# Patient Record
Sex: Male | Born: 1980
Health system: Southern US, Community
[De-identification: ages and names within clinical notes are randomized; demographics above are authoritative.]

## PROBLEM LIST (undated history)

## (undated) DIAGNOSIS — Z87442 Personal history of urinary calculi: Secondary | ICD-10-CM

## (undated) DIAGNOSIS — F411 Generalized anxiety disorder: Secondary | ICD-10-CM

## (undated) DIAGNOSIS — R351 Nocturia: Secondary | ICD-10-CM

## (undated) DIAGNOSIS — E785 Hyperlipidemia, unspecified: Secondary | ICD-10-CM

## (undated) DIAGNOSIS — N200 Calculus of kidney: Secondary | ICD-10-CM

## (undated) DIAGNOSIS — N5089 Other specified disorders of the male genital organs: Secondary | ICD-10-CM

## (undated) DIAGNOSIS — Z8616 Personal history of COVID-19: Secondary | ICD-10-CM

## (undated) DIAGNOSIS — Z973 Presence of spectacles and contact lenses: Secondary | ICD-10-CM

## (undated) HISTORY — DX: Generalized anxiety disorder: F41.1

## (undated) HISTORY — PX: KNEE SURGERY: SHX244

## (undated) HISTORY — DX: Calculus of kidney: N20.0

## (undated) HISTORY — PX: TONSILLECTOMY: SUR1361

## (undated) HISTORY — DX: Hyperlipidemia, unspecified: E78.5

---

## 2003-05-06 HISTORY — PX: MEDIAL PATELLOFEMORAL LIGAMENT REPAIR: SHX2020

## 2010-09-17 ENCOUNTER — Emergency Department (HOSPITAL_COMMUNITY): Payer: 59

## 2010-09-17 ENCOUNTER — Emergency Department (HOSPITAL_COMMUNITY)
Admission: EM | Admit: 2010-09-17 | Discharge: 2010-09-18 | Disposition: A | Discharge status: Home / Self Care | Payer: 59 | Attending: Emergency Medicine | Admitting: Emergency Medicine

## 2010-09-17 DIAGNOSIS — Z79899 Other long term (current) drug therapy: Secondary | ICD-10-CM | POA: Insufficient documentation

## 2010-09-17 DIAGNOSIS — R109 Unspecified abdominal pain: Secondary | ICD-10-CM | POA: Insufficient documentation

## 2010-09-17 DIAGNOSIS — R112 Nausea with vomiting, unspecified: Secondary | ICD-10-CM | POA: Insufficient documentation

## 2010-09-17 DIAGNOSIS — R197 Diarrhea, unspecified: Secondary | ICD-10-CM | POA: Insufficient documentation

## 2010-09-17 LAB — POCT I-STAT, CHEM 8
Calcium, Ion: 1.18 mmol/L (ref 1.12–1.32)
Chloride: 103 mEq/L (ref 96–112)
Creatinine, Ser: 1.2 mg/dL (ref 0.4–1.5)
Glucose, Bld: 111 mg/dL — ABNORMAL HIGH (ref 70–99)
HCT: 45 % (ref 39.0–52.0)

## 2010-09-17 LAB — URINALYSIS, ROUTINE W REFLEX MICROSCOPIC
Bilirubin Urine: NEGATIVE
Nitrite: NEGATIVE
Protein, ur: NEGATIVE mg/dL
Specific Gravity, Urine: 1.025 (ref 1.005–1.030)
Urobilinogen, UA: 0.2 mg/dL (ref 0.0–1.0)

## 2010-09-17 LAB — URINE MICROSCOPIC-ADD ON

## 2012-04-04 ENCOUNTER — Ambulatory Visit (INDEPENDENT_AMBULATORY_CARE_PROVIDER_SITE_OTHER): Payer: BC Managed Care – PPO | Admitting: Family Medicine

## 2012-04-04 VITALS — BP 133/81 | HR 80 | Temp 97.8°F | Resp 16 | Ht 71.5 in | Wt 189.0 lb

## 2012-04-04 DIAGNOSIS — R05 Cough: Secondary | ICD-10-CM

## 2012-04-04 DIAGNOSIS — J029 Acute pharyngitis, unspecified: Secondary | ICD-10-CM

## 2012-04-04 DIAGNOSIS — R059 Cough, unspecified: Secondary | ICD-10-CM

## 2012-04-04 MED ORDER — AMOXICILLIN 500 MG PO CAPS: 500.0000 mg | ORAL_CAPSULE | Freq: Three times a day (TID) | ORAL | Status: DC

## 2012-04-04 NOTE — Progress Notes (Signed)
[address]   [place], [place]  [address]   [phone]  Subjective:    Patient ID: Bruce Davidson, male    DOB: 1981/02/17, 31 y.o.   MRN: [id number]  HPI This 31 y.o. male presents for evaluation of sore throat, fever, malaise.  Onset one day ago.  Achy.  No fever but +sweats.  No headache.  Mild ear pain and chest.  +ST diffuse and lower; glands swollen.  +pain with swallowing.  No rhinorrhea or nasal congestion.   Some coughing; mild SOB.  No v/d.  No sputum production.  No rash. No sick contacts; owns staffing firm.   Took Airborne for symptoms.  History of recurrent bronchitis; non-smoker; no history of asthma.  Leaving out of country for vacation in three days.  PCP: [facility] [place].   Review of Systems  Constitutional: Positive for fatigue. Negative for fever, chills and diaphoresis.  HENT: Positive for ear pain, sore throat and trouble swallowing. Negative for congestion, rhinorrhea, mouth sores and voice change.   Respiratory: Positive for cough. Negative for shortness of breath, wheezing and stridor.   Gastrointestinal: Negative for nausea, vomiting and diarrhea.  Skin: Negative for rash.  Neurological: Negative for light-headedness and headaches.        History reviewed. No pertinent past medical history.  Past Surgical History  Procedure Date  . Knee surgery     left knee    Prior to Admission medications   Not on File    No Known Allergies  History   Social History  . Marital Status: Married    Spouse Name: N/A    Number of Children: N/A  . Years of Education: N/A   Occupational History  . Not on file.   Social History Main Topics  . Smoking status: Never Smoker   . Smokeless tobacco: Not on file  . Alcohol Use: No  . Drug Use: No  . Sexually Active: Yes   Other Topics Concern  . Not on file   Social History Narrative  . No narrative on file    Family History  Problem Relation Age of Onset  . Heart attack Father       Objective:   Physical Exam  Nursing note and vitals reviewed. Constitutional: He is oriented to person, place, and time. He appears well-developed and well-nourished. No distress.  HENT:  Head: Normocephalic and atraumatic.  Right Ear: External ear normal.  Left Ear: External ear normal.  Nose: Nose normal.  Mouth/Throat: Oropharynx is clear and moist. No oropharyngeal exudate.  Eyes: Conjunctivae normal and EOM are normal. Pupils are equal, round, and reactive to light.  Neck: Normal range of motion. Neck supple. No thyromegaly present.  Cardiovascular: Normal rate, regular rhythm and normal heart sounds.   Pulmonary/Chest: Effort normal and breath sounds normal. He has no wheezes. He has no rales.  Lymphadenopathy:    He has cervical adenopathy.  Neurological: He is alert and oriented to person, place, and time.  Skin: He is not diaphoretic.  Psychiatric: He has a normal mood and affect. His behavior is normal. Judgment and thought content normal.     Results for orders placed in visit on 04/04/12  POCT INFLUENZA A/B      Component Value Range   Influenza A, POC Negative     Influenza B, POC Negative    POCT RAPID STREP A (OFFICE)      Component Value Range   Rapid Strep A Screen Negative  Negative       Assessment & Plan:   1. Sore throat  POCT Influenza A/B, POCT rapid strep A, Culture, Group A Strep    1.  Sore throat:  New.  Send throat culture.  Treat empirically with Amoxicillin while awaiting throat culture due to leaving the country in three days.  Supportive care with rest, fluids, Ibuprofen or Tylenol PRN.  RTC inability to swallow. 2.  Cough:  New.  Supportive care with fluids, Mucinex DM.  RTC for development of SOB.

## 2012-04-04 NOTE — Patient Instructions (Addendum)
1. Sore throat  POCT Influenza A/B, POCT rapid strep A, Culture, Group A Strep, amoxicillin (AMOXIL) 500 MG capsule   Sore Throat Sore throats may be caused by bacteria and viruses. They may also be caused by:  Smoking.  Pollution.  Allergies. If a sore throat is due to strep infection (a bacterial infection), you may need:  A throat swab.  A culture test to verify the strep infection. You will need one of these:  An antibiotic shot.  Oral medicine for a full 10 days. Strep infection is very contagious. A doctor should check any close contacts who have a sore throat or fever. A sore throat caused by a virus infection will usually last only 3-4 days. Antibiotics will not treat a viral sore throat.  Infectious mononucleosis (a viral disease), however, can cause a sore throat that lasts for up to 3 weeks. Mononucleosis can be diagnosed with blood tests. You must have been sick for at least 1 week in order for the test to give accurate results. HOME CARE INSTRUCTIONS   To treat a sore throat, take mild pain medicine.  Increase your fluids.  Eat a soft diet.  Do not smoke.  Gargling with warm water or salt water (1 tsp. salt in 8 oz. water) can be helpful.  Try throat sprays or lozenges or sucking on hard candy to ease the symptoms. Call your doctor if your sore throat lasts longer than 1 week.  SEEK IMMEDIATE MEDICAL CARE IF:  You have difficulty breathing.  You have increased swelling in the throat.  You have pain so severe that you are unable to swallow fluids or your saliva.  You have a severe headache, a high fever, vomiting, or a red rash. Document Released: 05/29/2004 Document Revised: 07/14/2011 Document Reviewed: 04/08/2007 [facility] Patient Information 2013 [place], [place].

## 2012-04-06 LAB — CULTURE, GROUP A STREP: Organism ID, Bacteria: NORMAL

## 2012-09-28 ENCOUNTER — Ambulatory Visit (INDEPENDENT_AMBULATORY_CARE_PROVIDER_SITE_OTHER): Payer: BC Managed Care – PPO | Admitting: Physician Assistant

## 2012-09-28 VITALS — BP 120/86 | HR 78 | Temp 98.5°F | Resp 16 | Ht 71.0 in | Wt 182.0 lb

## 2012-09-28 DIAGNOSIS — J069 Acute upper respiratory infection, unspecified: Secondary | ICD-10-CM

## 2012-09-28 MED ORDER — IPRATROPIUM BROMIDE 0.03 % NA SOLN: 2.0000 | Freq: Two times a day (BID) | NASAL | Status: DC

## 2012-09-28 MED ORDER — HYDROCOD POLST-CHLORPHEN POLST 10-8 MG/5ML PO LQCR: 5.0000 mL | Freq: Two times a day (BID) | ORAL | Status: DC | PRN

## 2012-09-28 NOTE — Patient Instructions (Addendum)
Continue using the Nasacort nasal spray, but use it about 5 minutes AFTER the prescription spray (it will work better if it can get further into your sinuses, which the Atrovent will allow it to do). Stop the decongestant containing product, and use plain MUCINEX. Get plenty of rest and drink at least 64 ounces of water daily.

## 2012-09-28 NOTE — Progress Notes (Signed)
  Subjective:    Patient ID: Bruce Davidson, male    DOB: 08/20/1980, 32 y.o.   MRN: [id number]  HPI This 32 y.o. male presents for evaluation of laryngitis x 3 days.   Throat felt really tight, "Like I'd been screaming for three days."  "My lungs feel heavy and I'm coughing up green stuff."  Sinuses are congested and draining.  Worse in the mornings. More tired than usual, no fever/chills. No GI symptoms, no unexplained acheyness.  Mucinex D and OTC Nasacort nasal spray. Getting his voice back today.  Past medical history, surgical history, family history, social history and problem list reviewed.   Review of Systems As above.      Objective:   Physical Exam  Blood pressure 120/86, pulse 78, temperature 98.5 F (36.9 C), temperature source Oral, resp. rate 16, height 5\' 11"  (1.803 m), weight 182 lb (82.555 kg), SpO2 98.00%. Body mass index is 25.4 kg/(m^2). Well-developed, well nourished WM who is awake, alert and oriented, in NAD. HEENT: Joice/AT, PERRL, EOMI.  Sclera and conjunctiva are clear.  EAC are patent, TMs are normal in appearance. Nasal mucosa is pink and moist. OP is clear. Neck: supple, non-tender, no lymphadenopathy, thyromegaly. Heart: RRR, no murmur Lungs: normal effort, CTA Extremities: no cyanosis, clubbing or edema. Skin: warm and dry without rash. Psychologic: good mood and appropriate affect, normal speech and behavior.     Assessment & Plan:  Viral URI with cough - Plan: ipratropium (ATROVENT) 0.03 % nasal spray, chlorpheniramine-HYDROcodone (TUSSIONEX PENNKINETIC ER) 10-8 MG/5ML [place]  Patient Instructions  Continue using the Nasacort nasal spray, but use it about 5 minutes AFTER the prescription spray (it will work better if it can get further into your sinuses, which the Atrovent will allow it to do). Stop the decongestant containing product, and use plain MUCINEX. Get plenty of rest and drink at least 64 ounces of water daily.     Fernande Bras,  PA-C Physician Assistant-Certified [facility]

## 2013-02-08 ENCOUNTER — Ambulatory Visit (INDEPENDENT_AMBULATORY_CARE_PROVIDER_SITE_OTHER): Payer: BC Managed Care – PPO | Admitting: Family Medicine

## 2013-02-08 VITALS — BP 114/76 | HR 64 | Temp 98.0°F | Resp 18 | Ht 70.25 in | Wt 179.6 lb

## 2013-02-08 DIAGNOSIS — M79609 Pain in unspecified limb: Secondary | ICD-10-CM

## 2013-02-08 DIAGNOSIS — L600 Ingrowing nail: Secondary | ICD-10-CM

## 2013-02-08 DIAGNOSIS — M79675 Pain in left toe(s): Secondary | ICD-10-CM

## 2013-02-08 MED ORDER — TRAMADOL HCL 50 MG PO TABS: 50.0000 mg | ORAL_TABLET | Freq: Three times a day (TID) | ORAL | Status: DC | PRN

## 2013-02-08 MED ORDER — CEPHALEXIN 500 MG PO CAPS: 500.0000 mg | ORAL_CAPSULE | Freq: Three times a day (TID) | ORAL | Status: DC

## 2013-02-08 NOTE — Progress Notes (Signed)
Subjective: Patient has had history of recurrent problems with ingrown toenails. His left large toenail is ingrown again.  Objective Tender ingrown toenail with swelling of the medial aspect of the large toe.  Assessment: Ingrown toenail  Plan: Discussed wearing comfortable shoes We'll treat with Keflex after the nail was partially removed. Eula Listen PA will do the procedure.

## 2013-02-08 NOTE — Patient Instructions (Signed)
Take antibiotics as directed  Return if problems

## 2013-02-08 NOTE — Progress Notes (Signed)
   Patient ID: Bruce Davidson MRN: [id number], DOB: Oct 25, 1980, 32 y.o. Date of Encounter: 02/08/2013, 8:15 PM   PROCEDURE NOTE: Verbal consent obtained.  Risks and benefits explained.  Patient made informed decision under sound mind and judgement. Sterile technique employed. Numbing: Anesthesia obtained with 1:1 ratio 2% plain lidocaine with 0.5% Marcaine, 4 cc total. 2 cc lidocaine 2% plain added at base of toe nail and at distal aspect of toe for local.  Betadine prep per usual protocol.  Lateral nail lifted without difficulty and removed in total. Proximal aspect of nail bed explored revealing no nail remnants. Ingrown tissue debrided and irrigated. No active bleeding. Xeroform dressing applied. Wound care instructions including precautions covered with patient. Handout given.   Signed,  Eula Listen, PA-C [facility] [place], [place] [address] [phone] 02/08/2013 8:15 PM

## 2014-09-29 ENCOUNTER — Ambulatory Visit (INDEPENDENT_AMBULATORY_CARE_PROVIDER_SITE_OTHER): Payer: BLUE CROSS/BLUE SHIELD | Admitting: Family Medicine

## 2014-09-29 VITALS — BP 120/78 | HR 74 | Temp 99.1°F | Resp 17 | Ht 71.0 in | Wt 211.8 lb

## 2014-09-29 DIAGNOSIS — L600 Ingrowing nail: Secondary | ICD-10-CM

## 2014-09-29 DIAGNOSIS — M79675 Pain in left toe(s): Secondary | ICD-10-CM

## 2014-09-29 MED ORDER — CEPHALEXIN 500 MG PO CAPS: 500.0000 mg | ORAL_CAPSULE | Freq: Three times a day (TID) | ORAL | Status: DC

## 2014-09-29 NOTE — Progress Notes (Signed)
Procedure: Verbal consent obtained. Skin was cleaned with alcohol swab. Digital block performed with 6 cc 1:1 2% lido without epi and 0.5% marcaine on left great toe.  Wedge resection performed on medial toenail. Phenol applied to nailbed. Xeroform placed on nailbed. Dressing placed and wound care discussed.

## 2014-09-29 NOTE — Progress Notes (Signed)
  Subjective:  Patient ID: Bruce Davidson, male    DOB: [birth year]  Age: 34 y.o. MRN: [id number]  Patient has had problems with a recurrent ingrown toenail on the left large toe medially. He last had it removed a couple of years ago. He works as a Human resources officer, mostly a Designer, multimedia.   Objective:   No acute distress. Tender left large toenail medially area is not red or angry looking, but definitely ingrown  Assessment & Plan:   Assessment: Ingrown toenail and toe pain  Plan: Assisted PA Elmyra Ricks in removal  Patient Instructions  Keep wound clean and dry  Xeroform gauze  Keflex (cephalexin) 500 mg 3 times daily  Ibuprofen 800 mg 3 times daily as needed for pain  Return if problems or concerns    Bryley Chrisman, MD 09/29/2014

## 2014-09-29 NOTE — Patient Instructions (Signed)
Keep wound clean and dry  Xeroform gauze  Keflex (cephalexin) 500 mg 3 times daily  Ibuprofen 800 mg 3 times daily as needed for pain  Return if problems or concerns

## 2015-12-03 DIAGNOSIS — F4322 Adjustment disorder with anxiety: Secondary | ICD-10-CM | POA: Diagnosis not present

## 2015-12-19 DIAGNOSIS — F4322 Adjustment disorder with anxiety: Secondary | ICD-10-CM | POA: Diagnosis not present

## 2016-01-09 DIAGNOSIS — F4322 Adjustment disorder with anxiety: Secondary | ICD-10-CM | POA: Diagnosis not present

## 2016-01-30 DIAGNOSIS — F4322 Adjustment disorder with anxiety: Secondary | ICD-10-CM | POA: Diagnosis not present

## 2016-02-13 DIAGNOSIS — F4322 Adjustment disorder with anxiety: Secondary | ICD-10-CM | POA: Diagnosis not present

## 2016-03-03 DIAGNOSIS — F4322 Adjustment disorder with anxiety: Secondary | ICD-10-CM | POA: Diagnosis not present

## 2016-03-19 DIAGNOSIS — F4322 Adjustment disorder with anxiety: Secondary | ICD-10-CM | POA: Diagnosis not present

## 2016-04-07 DIAGNOSIS — D2262 Melanocytic nevi of left upper limb, including shoulder: Secondary | ICD-10-CM | POA: Diagnosis not present

## 2016-04-07 DIAGNOSIS — L300 Nummular dermatitis: Secondary | ICD-10-CM | POA: Diagnosis not present

## 2016-04-07 DIAGNOSIS — D225 Melanocytic nevi of trunk: Secondary | ICD-10-CM | POA: Diagnosis not present

## 2016-04-07 DIAGNOSIS — L821 Other seborrheic keratosis: Secondary | ICD-10-CM | POA: Diagnosis not present

## 2016-04-21 DIAGNOSIS — F4322 Adjustment disorder with anxiety: Secondary | ICD-10-CM | POA: Diagnosis not present

## 2016-05-14 DIAGNOSIS — Z0000 Encounter for general adult medical examination without abnormal findings: Secondary | ICD-10-CM | POA: Diagnosis not present

## 2016-06-16 DIAGNOSIS — B0089 Other herpesviral infection: Secondary | ICD-10-CM | POA: Diagnosis not present

## 2016-06-16 DIAGNOSIS — Z0000 Encounter for general adult medical examination without abnormal findings: Secondary | ICD-10-CM | POA: Diagnosis not present

## 2016-06-16 DIAGNOSIS — F4322 Adjustment disorder with anxiety: Secondary | ICD-10-CM | POA: Diagnosis not present

## 2016-06-16 DIAGNOSIS — G4709 Other insomnia: Secondary | ICD-10-CM | POA: Diagnosis not present

## 2016-06-16 DIAGNOSIS — Z1389 Encounter for screening for other disorder: Secondary | ICD-10-CM | POA: Diagnosis not present

## 2016-06-16 DIAGNOSIS — R030 Elevated blood-pressure reading, without diagnosis of hypertension: Secondary | ICD-10-CM | POA: Diagnosis not present

## 2016-06-16 DIAGNOSIS — E784 Other hyperlipidemia: Secondary | ICD-10-CM | POA: Diagnosis not present

## 2016-08-18 DIAGNOSIS — F4322 Adjustment disorder with anxiety: Secondary | ICD-10-CM | POA: Diagnosis not present

## 2016-09-08 DIAGNOSIS — F4322 Adjustment disorder with anxiety: Secondary | ICD-10-CM | POA: Diagnosis not present

## 2016-10-10 DIAGNOSIS — F4322 Adjustment disorder with anxiety: Secondary | ICD-10-CM | POA: Diagnosis not present

## 2016-11-03 DIAGNOSIS — F4322 Adjustment disorder with anxiety: Secondary | ICD-10-CM | POA: Diagnosis not present

## 2016-11-11 DIAGNOSIS — M545 Low back pain: Secondary | ICD-10-CM | POA: Diagnosis not present

## 2016-11-11 DIAGNOSIS — Z6829 Body mass index (BMI) 29.0-29.9, adult: Secondary | ICD-10-CM | POA: Diagnosis not present

## 2016-11-13 DIAGNOSIS — F4322 Adjustment disorder with anxiety: Secondary | ICD-10-CM | POA: Diagnosis not present

## 2016-11-22 DIAGNOSIS — S61512A Laceration without foreign body of left wrist, initial encounter: Secondary | ICD-10-CM | POA: Diagnosis not present

## 2016-11-22 DIAGNOSIS — R404 Transient alteration of awareness: Secondary | ICD-10-CM | POA: Diagnosis not present

## 2016-11-22 DIAGNOSIS — R42 Dizziness and giddiness: Secondary | ICD-10-CM | POA: Diagnosis not present

## 2016-11-22 DIAGNOSIS — S6992XA Unspecified injury of left wrist, hand and finger(s), initial encounter: Secondary | ICD-10-CM | POA: Diagnosis not present

## 2016-12-17 DIAGNOSIS — Z6830 Body mass index (BMI) 30.0-30.9, adult: Secondary | ICD-10-CM | POA: Diagnosis not present

## 2016-12-17 DIAGNOSIS — S61519S Laceration without foreign body of unspecified wrist, sequela: Secondary | ICD-10-CM | POA: Diagnosis not present

## 2016-12-23 DIAGNOSIS — M25532 Pain in left wrist: Secondary | ICD-10-CM | POA: Diagnosis not present

## 2016-12-23 DIAGNOSIS — M25639 Stiffness of unspecified wrist, not elsewhere classified: Secondary | ICD-10-CM | POA: Diagnosis not present

## 2016-12-23 DIAGNOSIS — R29898 Other symptoms and signs involving the musculoskeletal system: Secondary | ICD-10-CM | POA: Diagnosis not present

## 2016-12-25 DIAGNOSIS — F4322 Adjustment disorder with anxiety: Secondary | ICD-10-CM | POA: Diagnosis not present

## 2016-12-29 DIAGNOSIS — F4322 Adjustment disorder with anxiety: Secondary | ICD-10-CM | POA: Diagnosis not present

## 2016-12-30 DIAGNOSIS — R29898 Other symptoms and signs involving the musculoskeletal system: Secondary | ICD-10-CM | POA: Diagnosis not present

## 2016-12-30 DIAGNOSIS — M25639 Stiffness of unspecified wrist, not elsewhere classified: Secondary | ICD-10-CM | POA: Diagnosis not present

## 2017-01-06 DIAGNOSIS — M25532 Pain in left wrist: Secondary | ICD-10-CM | POA: Diagnosis not present

## 2017-01-06 DIAGNOSIS — M25639 Stiffness of unspecified wrist, not elsewhere classified: Secondary | ICD-10-CM | POA: Diagnosis not present

## 2017-01-06 DIAGNOSIS — R29898 Other symptoms and signs involving the musculoskeletal system: Secondary | ICD-10-CM | POA: Diagnosis not present

## 2017-01-15 DIAGNOSIS — F4322 Adjustment disorder with anxiety: Secondary | ICD-10-CM | POA: Diagnosis not present

## 2017-01-16 DIAGNOSIS — M25532 Pain in left wrist: Secondary | ICD-10-CM | POA: Diagnosis not present

## 2017-01-16 DIAGNOSIS — M25639 Stiffness of unspecified wrist, not elsewhere classified: Secondary | ICD-10-CM | POA: Diagnosis not present

## 2017-01-16 DIAGNOSIS — R29898 Other symptoms and signs involving the musculoskeletal system: Secondary | ICD-10-CM | POA: Diagnosis not present

## 2017-01-20 DIAGNOSIS — M25639 Stiffness of unspecified wrist, not elsewhere classified: Secondary | ICD-10-CM | POA: Diagnosis not present

## 2017-01-20 DIAGNOSIS — M25532 Pain in left wrist: Secondary | ICD-10-CM | POA: Diagnosis not present

## 2017-01-20 DIAGNOSIS — R29898 Other symptoms and signs involving the musculoskeletal system: Secondary | ICD-10-CM | POA: Diagnosis not present

## 2017-02-10 DIAGNOSIS — R29898 Other symptoms and signs involving the musculoskeletal system: Secondary | ICD-10-CM | POA: Diagnosis not present

## 2017-02-10 DIAGNOSIS — M25632 Stiffness of left wrist, not elsewhere classified: Secondary | ICD-10-CM | POA: Diagnosis not present

## 2017-02-23 DIAGNOSIS — F4322 Adjustment disorder with anxiety: Secondary | ICD-10-CM | POA: Diagnosis not present

## 2017-03-02 DIAGNOSIS — F4322 Adjustment disorder with anxiety: Secondary | ICD-10-CM | POA: Diagnosis not present

## 2017-03-09 DIAGNOSIS — F4322 Adjustment disorder with anxiety: Secondary | ICD-10-CM | POA: Diagnosis not present

## 2017-03-13 DIAGNOSIS — D2271 Melanocytic nevi of right lower limb, including hip: Secondary | ICD-10-CM | POA: Diagnosis not present

## 2017-03-13 DIAGNOSIS — Z3009 Encounter for other general counseling and advice on contraception: Secondary | ICD-10-CM | POA: Diagnosis not present

## 2017-03-13 DIAGNOSIS — L814 Other melanin hyperpigmentation: Secondary | ICD-10-CM | POA: Diagnosis not present

## 2017-03-13 DIAGNOSIS — D225 Melanocytic nevi of trunk: Secondary | ICD-10-CM | POA: Diagnosis not present

## 2017-03-13 DIAGNOSIS — D1801 Hemangioma of skin and subcutaneous tissue: Secondary | ICD-10-CM | POA: Diagnosis not present

## 2017-04-21 DIAGNOSIS — R030 Elevated blood-pressure reading, without diagnosis of hypertension: Secondary | ICD-10-CM | POA: Diagnosis not present

## 2017-04-21 DIAGNOSIS — R0789 Other chest pain: Secondary | ICD-10-CM | POA: Diagnosis not present

## 2017-05-15 DIAGNOSIS — Z302 Encounter for sterilization: Secondary | ICD-10-CM | POA: Diagnosis not present

## 2017-06-01 DIAGNOSIS — F4322 Adjustment disorder with anxiety: Secondary | ICD-10-CM | POA: Diagnosis not present

## 2017-06-10 DIAGNOSIS — F4322 Adjustment disorder with anxiety: Secondary | ICD-10-CM | POA: Diagnosis not present

## 2017-07-15 DIAGNOSIS — E7849 Other hyperlipidemia: Secondary | ICD-10-CM | POA: Diagnosis not present

## 2017-07-22 DIAGNOSIS — R5383 Other fatigue: Secondary | ICD-10-CM | POA: Diagnosis not present

## 2017-07-22 DIAGNOSIS — Z0000 Encounter for general adult medical examination without abnormal findings: Secondary | ICD-10-CM | POA: Diagnosis not present

## 2017-07-22 DIAGNOSIS — R030 Elevated blood-pressure reading, without diagnosis of hypertension: Secondary | ICD-10-CM | POA: Diagnosis not present

## 2017-07-22 DIAGNOSIS — E7849 Other hyperlipidemia: Secondary | ICD-10-CM | POA: Diagnosis not present

## 2017-07-22 DIAGNOSIS — R6882 Decreased libido: Secondary | ICD-10-CM | POA: Diagnosis not present

## 2017-08-13 DIAGNOSIS — B9689 Other specified bacterial agents as the cause of diseases classified elsewhere: Secondary | ICD-10-CM | POA: Diagnosis not present

## 2017-08-13 DIAGNOSIS — R05 Cough: Secondary | ICD-10-CM | POA: Diagnosis not present

## 2017-08-13 DIAGNOSIS — J019 Acute sinusitis, unspecified: Secondary | ICD-10-CM | POA: Diagnosis not present

## 2017-08-17 DIAGNOSIS — W57XXXA Bitten or stung by nonvenomous insect and other nonvenomous arthropods, initial encounter: Secondary | ICD-10-CM | POA: Diagnosis not present

## 2017-08-17 DIAGNOSIS — J329 Chronic sinusitis, unspecified: Secondary | ICD-10-CM | POA: Diagnosis not present

## 2017-08-17 DIAGNOSIS — Z6830 Body mass index (BMI) 30.0-30.9, adult: Secondary | ICD-10-CM | POA: Diagnosis not present

## 2017-11-06 DIAGNOSIS — L821 Other seborrheic keratosis: Secondary | ICD-10-CM | POA: Diagnosis not present

## 2017-11-06 DIAGNOSIS — L814 Other melanin hyperpigmentation: Secondary | ICD-10-CM | POA: Diagnosis not present

## 2017-11-06 DIAGNOSIS — L853 Xerosis cutis: Secondary | ICD-10-CM | POA: Diagnosis not present

## 2018-03-19 DIAGNOSIS — L820 Inflamed seborrheic keratosis: Secondary | ICD-10-CM | POA: Diagnosis not present

## 2018-03-19 DIAGNOSIS — D2262 Melanocytic nevi of left upper limb, including shoulder: Secondary | ICD-10-CM | POA: Diagnosis not present

## 2018-03-19 DIAGNOSIS — D2261 Melanocytic nevi of right upper limb, including shoulder: Secondary | ICD-10-CM | POA: Diagnosis not present

## 2018-03-19 DIAGNOSIS — D225 Melanocytic nevi of trunk: Secondary | ICD-10-CM | POA: Diagnosis not present

## 2018-03-19 DIAGNOSIS — L821 Other seborrheic keratosis: Secondary | ICD-10-CM | POA: Diagnosis not present

## 2018-04-22 DIAGNOSIS — M542 Cervicalgia: Secondary | ICD-10-CM | POA: Diagnosis not present

## 2018-04-22 DIAGNOSIS — Z23 Encounter for immunization: Secondary | ICD-10-CM | POA: Diagnosis not present

## 2018-07-27 DIAGNOSIS — E7849 Other hyperlipidemia: Secondary | ICD-10-CM | POA: Diagnosis not present

## 2018-07-27 DIAGNOSIS — R5383 Other fatigue: Secondary | ICD-10-CM | POA: Diagnosis not present

## 2018-07-28 DIAGNOSIS — R82998 Other abnormal findings in urine: Secondary | ICD-10-CM | POA: Diagnosis not present

## 2018-07-29 DIAGNOSIS — Z0000 Encounter for general adult medical examination without abnormal findings: Secondary | ICD-10-CM | POA: Diagnosis not present

## 2018-07-29 DIAGNOSIS — E785 Hyperlipidemia, unspecified: Secondary | ICD-10-CM | POA: Diagnosis not present

## 2018-07-29 DIAGNOSIS — R030 Elevated blood-pressure reading, without diagnosis of hypertension: Secondary | ICD-10-CM | POA: Diagnosis not present

## 2018-07-29 DIAGNOSIS — F411 Generalized anxiety disorder: Secondary | ICD-10-CM | POA: Diagnosis not present

## 2018-07-29 DIAGNOSIS — Z1331 Encounter for screening for depression: Secondary | ICD-10-CM | POA: Diagnosis not present

## 2018-08-09 DIAGNOSIS — F4322 Adjustment disorder with anxiety: Secondary | ICD-10-CM | POA: Diagnosis not present

## 2019-02-03 DIAGNOSIS — G4700 Insomnia, unspecified: Secondary | ICD-10-CM | POA: Diagnosis not present

## 2019-02-03 DIAGNOSIS — F411 Generalized anxiety disorder: Secondary | ICD-10-CM | POA: Diagnosis not present

## 2019-02-14 DIAGNOSIS — D225 Melanocytic nevi of trunk: Secondary | ICD-10-CM | POA: Diagnosis not present

## 2019-02-14 DIAGNOSIS — D2261 Melanocytic nevi of right upper limb, including shoulder: Secondary | ICD-10-CM | POA: Diagnosis not present

## 2019-02-14 DIAGNOSIS — L821 Other seborrheic keratosis: Secondary | ICD-10-CM | POA: Diagnosis not present

## 2019-02-14 DIAGNOSIS — D2262 Melanocytic nevi of left upper limb, including shoulder: Secondary | ICD-10-CM | POA: Diagnosis not present

## 2019-02-22 ENCOUNTER — Telehealth: Payer: Self-pay

## 2019-02-22 ENCOUNTER — Institutional Professional Consult (permissible substitution): Payer: BC Managed Care – PPO | Admitting: Neurology

## 2019-02-22 NOTE — Telephone Encounter (Signed)
Pt did not show for their appt with Dr. Athar today.  

## 2019-02-23 ENCOUNTER — Encounter: Payer: Self-pay | Admitting: Neurology

## 2019-02-23 DIAGNOSIS — F4322 Adjustment disorder with anxiety: Secondary | ICD-10-CM | POA: Diagnosis not present

## 2019-03-21 DIAGNOSIS — F4322 Adjustment disorder with anxiety: Secondary | ICD-10-CM | POA: Diagnosis not present

## 2019-06-03 DIAGNOSIS — H0100B Unspecified blepharitis left eye, upper and lower eyelids: Secondary | ICD-10-CM | POA: Diagnosis not present

## 2019-06-03 DIAGNOSIS — H5712 Ocular pain, left eye: Secondary | ICD-10-CM | POA: Diagnosis not present

## 2019-06-03 DIAGNOSIS — H0014 Chalazion left upper eyelid: Secondary | ICD-10-CM | POA: Diagnosis not present

## 2019-06-09 DIAGNOSIS — Z20822 Contact with and (suspected) exposure to covid-19: Secondary | ICD-10-CM | POA: Diagnosis not present

## 2019-08-02 DIAGNOSIS — E7849 Other hyperlipidemia: Secondary | ICD-10-CM | POA: Diagnosis not present

## 2019-08-02 DIAGNOSIS — Z0000 Encounter for general adult medical examination without abnormal findings: Secondary | ICD-10-CM | POA: Diagnosis not present

## 2019-08-02 DIAGNOSIS — Z125 Encounter for screening for malignant neoplasm of prostate: Secondary | ICD-10-CM | POA: Diagnosis not present

## 2019-08-09 ENCOUNTER — Other Ambulatory Visit: Payer: Self-pay | Admitting: Internal Medicine

## 2019-08-09 ENCOUNTER — Other Ambulatory Visit: Payer: Self-pay

## 2019-08-09 DIAGNOSIS — Z8249 Family history of ischemic heart disease and other diseases of the circulatory system: Secondary | ICD-10-CM

## 2019-08-09 DIAGNOSIS — E785 Hyperlipidemia, unspecified: Secondary | ICD-10-CM | POA: Diagnosis not present

## 2019-08-09 DIAGNOSIS — Z1212 Encounter for screening for malignant neoplasm of rectum: Secondary | ICD-10-CM | POA: Diagnosis not present

## 2019-08-09 DIAGNOSIS — Z1331 Encounter for screening for depression: Secondary | ICD-10-CM | POA: Diagnosis not present

## 2019-08-09 DIAGNOSIS — Z0000 Encounter for general adult medical examination without abnormal findings: Secondary | ICD-10-CM | POA: Diagnosis not present

## 2019-08-09 DIAGNOSIS — R82998 Other abnormal findings in urine: Secondary | ICD-10-CM | POA: Diagnosis not present

## 2019-08-09 DIAGNOSIS — F411 Generalized anxiety disorder: Secondary | ICD-10-CM | POA: Diagnosis not present

## 2019-08-09 DIAGNOSIS — R030 Elevated blood-pressure reading, without diagnosis of hypertension: Secondary | ICD-10-CM | POA: Diagnosis not present

## 2019-09-08 ENCOUNTER — Ambulatory Visit
Admission: RE | Admit: 2019-09-08 | Discharge: 2019-09-08 | Disposition: A | Discharge status: Home / Self Care | Payer: No Typology Code available for payment source | Source: Ambulatory Visit | Attending: Internal Medicine | Admitting: Internal Medicine

## 2019-09-08 DIAGNOSIS — Z8249 Family history of ischemic heart disease and other diseases of the circulatory system: Secondary | ICD-10-CM

## 2020-01-26 DIAGNOSIS — N500 Atrophy of testis: Secondary | ICD-10-CM | POA: Diagnosis not present

## 2020-01-26 DIAGNOSIS — I861 Scrotal varices: Secondary | ICD-10-CM | POA: Diagnosis not present

## 2020-02-03 DIAGNOSIS — N5082 Scrotal pain: Secondary | ICD-10-CM | POA: Diagnosis not present

## 2020-02-03 DIAGNOSIS — N500 Atrophy of testis: Secondary | ICD-10-CM | POA: Diagnosis not present

## 2020-02-16 DIAGNOSIS — D225 Melanocytic nevi of trunk: Secondary | ICD-10-CM | POA: Diagnosis not present

## 2020-02-16 DIAGNOSIS — L814 Other melanin hyperpigmentation: Secondary | ICD-10-CM | POA: Diagnosis not present

## 2020-02-16 DIAGNOSIS — L821 Other seborrheic keratosis: Secondary | ICD-10-CM | POA: Diagnosis not present

## 2020-02-16 DIAGNOSIS — D2262 Melanocytic nevi of left upper limb, including shoulder: Secondary | ICD-10-CM | POA: Diagnosis not present

## 2020-03-22 DIAGNOSIS — Z20822 Contact with and (suspected) exposure to covid-19: Secondary | ICD-10-CM | POA: Diagnosis not present

## 2020-04-04 DIAGNOSIS — N5082 Scrotal pain: Secondary | ICD-10-CM | POA: Diagnosis not present

## 2020-04-04 DIAGNOSIS — K4090 Unilateral inguinal hernia, without obstruction or gangrene, not specified as recurrent: Secondary | ICD-10-CM | POA: Diagnosis not present

## 2020-04-18 DIAGNOSIS — R197 Diarrhea, unspecified: Secondary | ICD-10-CM | POA: Diagnosis not present

## 2020-05-16 DIAGNOSIS — N50812 Left testicular pain: Secondary | ICD-10-CM | POA: Diagnosis not present

## 2020-07-25 DIAGNOSIS — F4322 Adjustment disorder with anxiety: Secondary | ICD-10-CM | POA: Diagnosis not present

## 2020-08-08 DIAGNOSIS — N5082 Scrotal pain: Secondary | ICD-10-CM | POA: Diagnosis not present

## 2020-08-10 ENCOUNTER — Other Ambulatory Visit: Payer: Self-pay | Admitting: Urology

## 2020-08-10 ENCOUNTER — Encounter (HOSPITAL_BASED_OUTPATIENT_CLINIC_OR_DEPARTMENT_OTHER): Payer: Self-pay | Admitting: Urology

## 2020-08-10 ENCOUNTER — Other Ambulatory Visit: Payer: Self-pay

## 2020-08-10 DIAGNOSIS — N5082 Scrotal pain: Secondary | ICD-10-CM | POA: Diagnosis not present

## 2020-08-10 NOTE — Progress Notes (Signed)
Spoke w/ via phone for pre-op interview--- PT Lab needs dos---- no              Lab results------ no COVID test ------ pt having rapid covid test dos .  Pt aware when arrive front of building call front desk @[facility]  then after nurse does test wait in car in parking lost until receives call from [facility] that test is negative then come check-in Arrive at ------- [facility] on 08-13-2020 NPO after MN NO Solid Food.  Clear liquids from MN until--- 0615 Med rec completed Medications to take morning of surgery ----- NONE Diabetic medication ----- n/a Patient instructed to bring photo id and insurance card day of surgery Patient aware to have Driver (ride ) / caregiver    for 24 hours after surgery-- wife, Bruce Davidson  Patient Special Instructions ----- n/a Pre-Op special Istructions ----- n/a Patient verbalized understanding of instructions that were given at this phone interview. Patient denies shortness of breath, chest pain, fever, cough at this phone interview.

## 2020-08-12 NOTE — H&P (Signed)
H&P  Chief Complaint: Lt testicular mass  History of Present Illness: 40 yo male s/p vasectomy in 2019 presents for Lt inguinal orchiectomy for mgmt of a Lt testicular mass. He has had persistent Lt testicular pain since the procedure, and has been seen several times for c/o of pain. I have seen him on a few of those occasions. Recently I noted that his exam has changed--I noted a firm Lt testicle. U/S revealed cystic/hyperechoic changes. I recommended Lt inguinal orchiectomy as I feel that there is a significant chance of him having testicular cancer. Serum markers have been drawn. I have discussed the surgery w/ the pt and his wife. We have discussed risks/complications as well as expected outcomes. They desire to proceed.  Past Medical History:  Diagnosis Date  . Anxiety, generalized   . History of 2019 novel coronavirus disease (COVID-19)    per pt positive home covid test 01/ 2022, stated mild symptoms that resolved  . History of kidney stones   . Hyperlipidemia   . Mass of left testicle   . Nocturia   . Wears contact lenses     Past Surgical History:  Procedure Laterality Date  . MEDIAL PATELLOFEMORAL LIGAMENT REPAIR Left 2005  . TONSILLECTOMY  chld    Home Medications:  Allergies as of 08/12/2020   No Known Allergies     Medication List    Notice   Cannot display discharge medications because the patient has not yet been admitted.     Allergies: No Known Allergies  Family History  Problem Relation Age of Onset  . Heart attack Father 23    Social History:  reports that he has never smoked. He has never used smokeless tobacco. He reports current alcohol use. He reports that he does not use drugs.  ROS: A complete review of systems was performed.  All systems are negative except for pertinent findings as noted.  Physical Exam:  Vital signs in last 24 hours: Ht 5\' 11"  (1.803 m)   Wt 99.8 kg   BMI 30.68 kg/m  Constitutional:  Alert and oriented, No acute  distress Cardiovascular: Regular rate  Respiratory: Normal respiratory effort GI: Abdomen is soft, nontender, nondistended, no abdominal masses. No CVAT.  Genitourinary: Normal male phallus, testes are descended bilaterally. Lt testicle firm, heavier. Lymphatic: No lymphadenopathy Neurologic: Grossly intact, no focal deficits Psychiatric: Normal mood and affect   Impression/Assessment:  Lt testicular mass  Plan:  Lt inguinal orchiectomy

## 2020-08-13 ENCOUNTER — Ambulatory Visit (HOSPITAL_BASED_OUTPATIENT_CLINIC_OR_DEPARTMENT_OTHER)
Admission: RE | Admit: 2020-08-13 | Discharge: 2020-08-13 | Disposition: A | Discharge status: Home / Self Care | Payer: BC Managed Care – PPO | Source: Ambulatory Visit | Attending: Urology | Admitting: Urology

## 2020-08-13 ENCOUNTER — Encounter (HOSPITAL_BASED_OUTPATIENT_CLINIC_OR_DEPARTMENT_OTHER): Payer: Self-pay | Admitting: Urology

## 2020-08-13 ENCOUNTER — Ambulatory Visit (HOSPITAL_BASED_OUTPATIENT_CLINIC_OR_DEPARTMENT_OTHER): Payer: BC Managed Care – PPO | Admitting: Anesthesiology

## 2020-08-13 ENCOUNTER — Encounter (HOSPITAL_BASED_OUTPATIENT_CLINIC_OR_DEPARTMENT_OTHER)
Admission: RE | Disposition: A | Discharge status: Home / Self Care | Payer: Self-pay | Source: Ambulatory Visit | Attending: Urology

## 2020-08-13 ENCOUNTER — Other Ambulatory Visit: Payer: Self-pay

## 2020-08-13 DIAGNOSIS — C6292 Malignant neoplasm of left testis, unspecified whether descended or undescended: Secondary | ICD-10-CM | POA: Insufficient documentation

## 2020-08-13 DIAGNOSIS — Z8249 Family history of ischemic heart disease and other diseases of the circulatory system: Secondary | ICD-10-CM | POA: Insufficient documentation

## 2020-08-13 DIAGNOSIS — E785 Hyperlipidemia, unspecified: Secondary | ICD-10-CM | POA: Diagnosis not present

## 2020-08-13 DIAGNOSIS — Z8616 Personal history of COVID-19: Secondary | ICD-10-CM | POA: Insufficient documentation

## 2020-08-13 DIAGNOSIS — N5089 Other specified disorders of the male genital organs: Secondary | ICD-10-CM | POA: Diagnosis not present

## 2020-08-13 DIAGNOSIS — N509 Disorder of male genital organs, unspecified: Secondary | ICD-10-CM | POA: Diagnosis not present

## 2020-08-13 DIAGNOSIS — Z87442 Personal history of urinary calculi: Secondary | ICD-10-CM | POA: Diagnosis not present

## 2020-08-13 DIAGNOSIS — F411 Generalized anxiety disorder: Secondary | ICD-10-CM | POA: Diagnosis not present

## 2020-08-13 HISTORY — DX: Personal history of COVID-19: Z86.16

## 2020-08-13 HISTORY — DX: Presence of spectacles and contact lenses: Z97.3

## 2020-08-13 HISTORY — DX: Nocturia: R35.1

## 2020-08-13 HISTORY — DX: Other specified disorders of the male genital organs: N50.89

## 2020-08-13 HISTORY — PX: ORCHIECTOMY: SHX2116

## 2020-08-13 HISTORY — DX: Personal history of urinary calculi: Z87.442

## 2020-08-13 LAB — RESP PANEL BY RT-PCR (FLU A&B, COVID) ARPGX2
Influenza A by PCR: NEGATIVE
Influenza B by PCR: NEGATIVE
SARS Coronavirus 2 by RT PCR: NEGATIVE

## 2020-08-13 SURGERY — ORCHIECTOMY
Anesthesia: General | Site: Scrotum | Laterality: Left

## 2020-08-13 MED ORDER — CEFAZOLIN SODIUM-DEXTROSE 2-4 GM/100ML-% IV SOLN
2.0000 g | INTRAVENOUS | Status: AC
  Administered 2020-08-13: 2 g via INTRAVENOUS

## 2020-08-13 MED ORDER — FENTANYL CITRATE (PF) 100 MCG/2ML IJ SOLN
INTRAMUSCULAR | Status: DC | PRN
  Administered 2020-08-13 (×2): 50 ug via INTRAVENOUS
  Administered 2020-08-13: 100 ug via INTRAVENOUS

## 2020-08-13 MED ORDER — ACETAMINOPHEN 500 MG PO TABS: 1000.0000 mg | ORAL_TABLET | Freq: Once | ORAL | Status: DC

## 2020-08-13 MED ORDER — PROPOFOL 10 MG/ML IV BOLUS
INTRAVENOUS | Status: AC
  Filled 2020-08-13: qty 20

## 2020-08-13 MED ORDER — MIDAZOLAM HCL 5 MG/5ML IJ SOLN
INTRAMUSCULAR | Status: DC | PRN
  Administered 2020-08-13: 2 mg via INTRAVENOUS

## 2020-08-13 MED ORDER — MIDAZOLAM HCL 2 MG/2ML IJ SOLN
INTRAMUSCULAR | Status: AC
  Filled 2020-08-13: qty 2

## 2020-08-13 MED ORDER — CEFAZOLIN SODIUM-DEXTROSE 2-4 GM/100ML-% IV SOLN
INTRAVENOUS | Status: AC
  Filled 2020-08-13: qty 100

## 2020-08-13 MED ORDER — BUPIVACAINE HCL (PF) 0.25 % IJ SOLN
INTRAMUSCULAR | Status: DC | PRN
  Administered 2020-08-13: 10 mL

## 2020-08-13 MED ORDER — DEXAMETHASONE SODIUM PHOSPHATE 10 MG/ML IJ SOLN
INTRAMUSCULAR | Status: AC
  Filled 2020-08-13: qty 1

## 2020-08-13 MED ORDER — DEXAMETHASONE SODIUM PHOSPHATE 10 MG/ML IJ SOLN
INTRAMUSCULAR | Status: DC | PRN
  Administered 2020-08-13: 10 mg via INTRAVENOUS

## 2020-08-13 MED ORDER — AMISULPRIDE (ANTIEMETIC) 5 MG/2ML IV SOLN
10.0000 mg | Freq: Once | INTRAVENOUS | Status: DC | PRN
Start: 2020-08-13 — End: 2020-08-13

## 2020-08-13 MED ORDER — ONDANSETRON HCL 4 MG/2ML IJ SOLN
INTRAMUSCULAR | Status: DC | PRN
  Administered 2020-08-13: 4 mg via INTRAVENOUS

## 2020-08-13 MED ORDER — KETOROLAC TROMETHAMINE 30 MG/ML IJ SOLN
INTRAMUSCULAR | Status: DC | PRN
  Administered 2020-08-13: 30 mg via INTRAVENOUS

## 2020-08-13 MED ORDER — LACTATED RINGERS IV SOLN: INTRAVENOUS | Status: DC

## 2020-08-13 MED ORDER — LIDOCAINE 2% (20 MG/ML) 5 ML SYRINGE
INTRAMUSCULAR | Status: DC | PRN
  Administered 2020-08-13: 80 mg via INTRAVENOUS

## 2020-08-13 MED ORDER — FENTANYL CITRATE (PF) 250 MCG/5ML IJ SOLN
INTRAMUSCULAR | Status: AC
  Filled 2020-08-13: qty 5

## 2020-08-13 MED ORDER — TRAMADOL HCL 50 MG PO TABS: 50.0000 mg | ORAL_TABLET | Freq: Four times a day (QID) | ORAL | 0 refills | Status: DC | PRN

## 2020-08-13 MED ORDER — FENTANYL CITRATE (PF) 100 MCG/2ML IJ SOLN: 25.0000 ug | INTRAMUSCULAR | Status: DC | PRN

## 2020-08-13 MED ORDER — LIDOCAINE 2% (20 MG/ML) 5 ML SYRINGE
INTRAMUSCULAR | Status: AC
  Filled 2020-08-13: qty 5

## 2020-08-13 MED ORDER — ONDANSETRON HCL 4 MG/2ML IJ SOLN
INTRAMUSCULAR | Status: AC
  Filled 2020-08-13: qty 2

## 2020-08-13 MED ORDER — PROPOFOL 10 MG/ML IV BOLUS
INTRAVENOUS | Status: DC | PRN
  Administered 2020-08-13: 200 mg via INTRAVENOUS

## 2020-08-13 SURGICAL SUPPLY — 47 items
ADH SKN CLS APL DERMABOND .7 (GAUZE/BANDAGES/DRESSINGS) ×1
BLADE CLIPPER SENSICLIP SURGIC (BLADE) ×1 IMPLANT
BLADE SURG 15 STRL LF DISP TIS (BLADE) ×1 IMPLANT
BLADE SURG 15 STRL SS (BLADE) ×2
BNDG GAUZE ELAST 4 BULKY (GAUZE/BANDAGES/DRESSINGS) ×2 IMPLANT
COVER BACK TABLE 60X90IN (DRAPES) ×2 IMPLANT
COVER MAYO STAND STRL (DRAPES) ×2 IMPLANT
COVER WAND RF STERILE (DRAPES) ×2 IMPLANT
DERMABOND ADVANCED (GAUZE/BANDAGES/DRESSINGS) ×1
DERMABOND ADVANCED .7 DNX12 (GAUZE/BANDAGES/DRESSINGS) ×1 IMPLANT
DISSECTOR ROUND CHERRY 3/8 STR (MISCELLANEOUS) IMPLANT
DRAPE LAPAROTOMY 100X72 PEDS (DRAPES) ×2 IMPLANT
ELECT REM PT RETURN 9FT ADLT (ELECTROSURGICAL) ×2
ELECTRODE REM PT RTRN 9FT ADLT (ELECTROSURGICAL) ×1 IMPLANT
GLOVE SURG ENC MOIS LTX SZ6.5 (GLOVE) ×2 IMPLANT
GLOVE SURG ENC MOIS LTX SZ8 (GLOVE) ×2 IMPLANT
GLOVE SURG UNDER POLY LF SZ6.5 (GLOVE) ×1 IMPLANT
GLOVE SURG UNDER POLY LF SZ7 (GLOVE) ×2 IMPLANT
GOWN STRL REUS W/ TWL LRG LVL3 (GOWN DISPOSABLE) IMPLANT
GOWN STRL REUS W/TWL LRG LVL3 (GOWN DISPOSABLE) ×3 IMPLANT
GOWN STRL REUS W/TWL XL LVL3 (GOWN DISPOSABLE) ×2 IMPLANT
KIT TURNOVER CYSTO (KITS) ×2 IMPLANT
NDL HYPO 25X1 1.5 SAFETY (NEEDLE) ×1 IMPLANT
NEEDLE HYPO 25X1 1.5 SAFETY (NEEDLE) ×2 IMPLANT
NS IRRIG 500ML POUR BTL (IV SOLUTION) ×2 IMPLANT
PACK BASIN DAY SURGERY FS (CUSTOM PROCEDURE TRAY) ×2 IMPLANT
PENCIL SMOKE EVACUATOR (MISCELLANEOUS) ×2 IMPLANT
SET COLLECT BLD 21X.75 12 PB G (NEEDLE) IMPLANT
SUPPORT SCROTAL LG STRP (MISCELLANEOUS) ×2 IMPLANT
SUPPORT SCROTAL MED ADLT STRP (MISCELLANEOUS) IMPLANT
SUT MNCRL AB 4-0 PS2 18 (SUTURE) ×2 IMPLANT
SUT PROLENE 4 0 RB 1 (SUTURE) ×2
SUT PROLENE 4-0 RB1 .5 CRCL 36 (SUTURE) IMPLANT
SUT SILK 0 TIES 10X30 (SUTURE) ×2 IMPLANT
SUT VIC AB 2-0 SH 27 (SUTURE) ×2
SUT VIC AB 2-0 SH 27XBRD (SUTURE) IMPLANT
SUT VIC AB 3-0 SH 27 (SUTURE)
SUT VIC AB 3-0 SH 27X BRD (SUTURE) ×1 IMPLANT
SUT VIC AB 4-0 SH 27 (SUTURE)
SUT VIC AB 4-0 SH 27XANBCTRL (SUTURE) IMPLANT
SYR BULB IRRIG 60ML STRL (SYRINGE) IMPLANT
SYR CONTROL 10ML LL (SYRINGE) ×2 IMPLANT
TOWEL OR 17X26 10 PK STRL BLUE (TOWEL DISPOSABLE) ×3 IMPLANT
TRAY DSU PREP LF (CUSTOM PROCEDURE TRAY) ×2 IMPLANT
TUBE CONNECTING 12X1/4 (SUCTIONS) ×2 IMPLANT
WATER STERILE IRR 500ML POUR (IV SOLUTION) IMPLANT
YANKAUER SUCT BULB TIP NO VENT (SUCTIONS) ×2 IMPLANT

## 2020-08-13 NOTE — Anesthesia Preprocedure Evaluation (Signed)
Anesthesia Evaluation  Patient identified by MRN, date of birth, ID band Patient awake    Reviewed: Allergy & Precautions, NPO status , Patient's Chart, lab work & pertinent test results  Airway Mallampati: II  TM Distance: >3 FB Neck ROM: Full    Dental  (+) Dental Advisory Given   Pulmonary neg pulmonary ROS,    breath sounds clear to auscultation       Cardiovascular negative cardio ROS   Rhythm:Regular Rate:Normal     Neuro/Psych negative neurological ROS     GI/Hepatic negative GI ROS, Neg liver ROS,   Endo/Other  negative endocrine ROS  Renal/GU negative Renal ROS   Testicular CA    Musculoskeletal   Abdominal   Peds  Hematology negative hematology ROS (+)   Anesthesia Other Findings   Reproductive/Obstetrics                             Anesthesia Physical Anesthesia Plan  ASA: II  Anesthesia Plan: General   Post-op Pain Management:    Induction: Intravenous  PONV Risk Score and Plan: 2 and Dexamethasone, Ondansetron, Midazolam and Treatment may vary due to age or medical condition  Airway Management Planned: LMA  Additional Equipment:   Intra-op Plan:   Post-operative Plan: Extubation in OR  Informed Consent: I have reviewed the patients History and Physical, chart, labs and discussed the procedure including the risks, benefits and alternatives for the proposed anesthesia with the patient or authorized representative who has indicated his/her understanding and acceptance.     Dental advisory given  Plan Discussed with: CRNA  Anesthesia Plan Comments:         Anesthesia Quick Evaluation

## 2020-08-13 NOTE — Transfer of Care (Signed)
Immediate Anesthesia Transfer of Care Note  Patient: Bruce Davidson  Procedure(s) Performed: ORCHIECTOMY (Left Scrotum)  Patient Location: PACU  Anesthesia Type:General  Level of Consciousness: awake, alert , oriented and patient cooperative  Airway & Oxygen Therapy: Patient Spontanous Breathing  Post-op Assessment: Report given to RN and Post -op Vital signs reviewed and stable  Post vital signs: Reviewed and stable  Last Vitals:  Vitals Value Taken Time  BP 147/94 08/13/20 0949  Temp    Pulse 87 08/13/20 0954  Resp 17 08/13/20 0954  SpO2 97 % 08/13/20 0954  Vitals shown include unvalidated device data.  Last Pain:  Vitals:   08/13/20 0729  TempSrc: Oral         Complications: No complications documented.

## 2020-08-13 NOTE — Discharge Instructions (Signed)
HOME CARE INSTRUCTIONS FOR SCROTAL PROCEDURES  Wound Care & Hygiene: You may apply an ice bag to the scrotum for the first 24 hours.  This may help decrease swelling and soreness.  You may have a dressing held in place by an athletic supporter.  You may remove the dressing in 24 hours and shower in 48 hours.  Continue to use the athletic supporter or tight briefs for at least a week. Activity: Rest today - not necessarily flat bed rest.  Just take it easy.  You should not do strenuous activities until your follow-up visit with your doctor.  You may resume light activity in 48 hours.  Return to Work:  Your doctor will advise you of this depending on the type of work you do  Diet: Drink liquids or eat a light diet this evening.  You may resume a regular diet tomorrow.  General Expectations: You may have a small amount of bleeding.  The scrotum may be swollen or bruised for about a week.  Call your Doctor if these occur:  -persistent or heavy bleeding  -temperature of 101 degrees or more  -severe pain, not relieved by your pain medication  Return to [employer]:  Call to set up and appointment.  Patient Signature:  __________________________________________________  Nurse's Signature:  __________________________________________________      NO IBUPROFEN, ADVIL, MOTRIN, ALEVE, OR NAPROXEN UNTIL 3:30 PM TODAY.    Post Anesthesia Home Care Instructions  Activity: Get plenty of rest for the remainder of the day. A responsible individual must stay with you for 24 hours following the procedure.  For the next 24 hours, DO NOT: -Drive a car -Paediatric nurse -Drink alcoholic beverages -Take any medication unless instructed by your physician -Make any legal decisions or sign important papers.  Meals: Start with liquid foods such as gelatin or soup. Progress to regular foods as tolerated. Avoid greasy, spicy, heavy foods. If nausea and/or vomiting occur, drink only clear  liquids until the nausea and/or vomiting subsides. Call your physician if vomiting continues.  Special Instructions/Symptoms: Your throat may feel dry or sore from the anesthesia or the breathing tube placed in your throat during surgery. If this causes discomfort, gargle with warm salt water. The discomfort should disappear within 24 hours.

## 2020-08-13 NOTE — Op Note (Signed)
Preoperative diagnosis: Left testicular mass  Postoperative diagnosis: Same  Principal procedure: Left inguinal orchiectomy  Attending surgeon: Almarosa Bohac  Resident Surgeon: Celene Squibb, MD  Anesthesia: General with LMA  Complications: None  Specimen: Left testicle and cord, to pathology  Estimated blood loss: Less than 30 mL  Indications: 40 year old male with left testicular mass.  He has a long history of left orchialgia following vasa ectomy approximately 3 years ago.  Recent evaluation revealed the patient to have a change in his physical exam with a firm, heavy left testicle.  Scrotal ultrasound revealed significant abnormality with cystic and hypoechoic changes within the testicle.  I have strongly recommended that the patient have inguinal orchiectomy.  He has had serum markers drawn.  I discussed the procedure with the patient and his wife, and risks and complications including but not limited to bleeding, infection, anesthetic complications, among others.  He understands and desires to proceed.  Description of procedure: The patient was properly identified and marked in the holding area.  Is taken to the operating room where general anesthetic was administered with the LMA.  IV antibiotics were administered.  His lower abdomen on the left side, genitalia and perineum were prepped and draped.  Proper timeout was performed.  A 3 cm incision was then made in an oblique fashion overlying the left external inguinal ring.  This is carried down through subcutaneous tissue with combined blunt and sharp dissection.  The cord was identified.  Pressure was placed on the left testicle in a superior direction, delivering the testicle into the wound.  Gubernaculum was then incised.  The testicle was then popped through the incision and dissection was carried up superiorly along the cord structures.  The ilioinguinal nerve was never identified.  Once the cord was skeletonized, it was doubly  clamped proximally and then divided.  Each of the 2 tissue packets were then doubly ligated with 0 silk ties.  The cord proximal this was then infiltrated with 6 mL of quarter percent plain Marcaine.  The subcutaneous tissues were also infiltrated with the Marcaine.  The scrotum was everted into the wound and found to be hemostatic.  The subcutaneous tissue was then closed with a running 2-0 Vicryl once immaculate hemostasis was obtained.  The skin edges were reapproximated using a 4-0 Monocryl placed in a running subcuticular fashion.  Dermabond was then placed.  Scrotal compressive dressing was placed using 4 oh Kerlix and a jockstrap.  At this point, the procedure was terminated.  The patient was awakened, taken to the PACU in stable condition.  He tolerated procedure well.  Sponge needle instrument counts were correct x2.

## 2020-08-13 NOTE — Anesthesia Procedure Notes (Signed)
Procedure Name: LMA Insertion Date/Time: 08/13/2020 9:05 AM Performed by: Rogers Blocker, CRNA Pre-anesthesia Checklist: Patient identified, Emergency Drugs available, Suction available and Patient being monitored Patient Re-evaluated:Patient Re-evaluated prior to induction Oxygen Delivery Method: Circle System Utilized Preoxygenation: Pre-oxygenation with 100% oxygen Induction Type: IV induction Ventilation: Mask ventilation without difficulty LMA: LMA inserted LMA Size: 5.0 Number of attempts: 1 Placement Confirmation: positive ETCO2 Tube secured with: Tape Dental Injury: Teeth and Oropharynx as per pre-operative assessment

## 2020-08-13 NOTE — Interval H&P Note (Signed)
History and Physical Interval Note:  08/13/2020 8:50 AM  Bruce Davidson  has presented today for surgery, with the diagnosis of LEFT TESTICULAR MASS.  The various methods of treatment have been discussed with the patient and family. After consideration of risks, benefits and other options for treatment, the patient has consented to  Procedure(s) with comments: ORCHIECTOMY (Left) - 45 MINS as a surgical intervention.  The patient's history has been reviewed, patient examined, no change in status, stable for surgery.  I have reviewed the patient's chart and labs.  Questions were answered to the patient's satisfaction.     Lillette Boxer Corri Delapaz

## 2020-08-14 LAB — SURGICAL PATHOLOGY

## 2020-08-14 NOTE — Anesthesia Postprocedure Evaluation (Signed)
Anesthesia Post Note  Patient: Bruce Davidson  Procedure(s) Performed: ORCHIECTOMY (Left Scrotum)     Patient location during evaluation: PACU Anesthesia Type: General Level of consciousness: awake and alert Pain management: pain level controlled Vital Signs Assessment: post-procedure vital signs reviewed and stable Respiratory status: spontaneous breathing, nonlabored ventilation, respiratory function stable and patient connected to nasal cannula oxygen Cardiovascular status: blood pressure returned to baseline and stable Postop Assessment: no apparent nausea or vomiting Anesthetic complications: no   No complications documented.  Last Vitals:  Vitals:   08/13/20 1015 08/13/20 1055  BP: 134/86 (!) 140/100  Pulse: 66 70  Resp: (!) 8 16  Temp: 36.6 C 36.6 C  SpO2: 96% 99%    Last Pain:  Vitals:   08/14/20 1119  TempSrc:   PainSc: 2                  Tiajuana Amass

## 2020-08-15 ENCOUNTER — Encounter (HOSPITAL_BASED_OUTPATIENT_CLINIC_OR_DEPARTMENT_OTHER): Payer: Self-pay | Admitting: Urology

## 2020-08-20 DIAGNOSIS — C6212 Malignant neoplasm of descended left testis: Secondary | ICD-10-CM | POA: Diagnosis not present

## 2020-08-22 DIAGNOSIS — E785 Hyperlipidemia, unspecified: Secondary | ICD-10-CM | POA: Diagnosis not present

## 2020-08-22 DIAGNOSIS — Z0000 Encounter for general adult medical examination without abnormal findings: Secondary | ICD-10-CM | POA: Diagnosis not present

## 2020-08-22 DIAGNOSIS — Z125 Encounter for screening for malignant neoplasm of prostate: Secondary | ICD-10-CM | POA: Diagnosis not present

## 2020-08-22 DIAGNOSIS — N5089 Other specified disorders of the male genital organs: Secondary | ICD-10-CM | POA: Diagnosis not present

## 2020-08-23 DIAGNOSIS — N200 Calculus of kidney: Secondary | ICD-10-CM | POA: Diagnosis not present

## 2020-08-23 DIAGNOSIS — C6212 Malignant neoplasm of descended left testis: Secondary | ICD-10-CM | POA: Diagnosis not present

## 2020-08-23 DIAGNOSIS — J218 Acute bronchiolitis due to other specified organisms: Secondary | ICD-10-CM | POA: Diagnosis not present

## 2020-08-29 ENCOUNTER — Telehealth: Payer: Self-pay | Admitting: Oncology

## 2020-08-29 DIAGNOSIS — Z0000 Encounter for general adult medical examination without abnormal findings: Secondary | ICD-10-CM | POA: Diagnosis not present

## 2020-08-29 DIAGNOSIS — R82998 Other abnormal findings in urine: Secondary | ICD-10-CM | POA: Diagnosis not present

## 2020-08-29 DIAGNOSIS — R030 Elevated blood-pressure reading, without diagnosis of hypertension: Secondary | ICD-10-CM | POA: Diagnosis not present

## 2020-08-29 DIAGNOSIS — Z1331 Encounter for screening for depression: Secondary | ICD-10-CM | POA: Diagnosis not present

## 2020-08-29 DIAGNOSIS — Z1339 Encounter for screening examination for other mental health and behavioral disorders: Secondary | ICD-10-CM | POA: Diagnosis not present

## 2020-08-29 NOTE — Telephone Encounter (Signed)
Received a new pt referral from Bruce Davidson for testicular cancer. Bruce Davidson has been cld and scheduled to see Dr. Alen Blew on 5/3 at 11am. Pt aware to arrive 20 minutes early.

## 2020-08-30 DIAGNOSIS — C6212 Malignant neoplasm of descended left testis: Secondary | ICD-10-CM | POA: Diagnosis not present

## 2020-09-03 ENCOUNTER — Telehealth: Payer: Self-pay | Admitting: *Deleted

## 2020-09-03 ENCOUNTER — Telehealth: Payer: Self-pay | Admitting: Oncology

## 2020-09-03 DIAGNOSIS — C6212 Malignant neoplasm of descended left testis: Secondary | ICD-10-CM | POA: Diagnosis not present

## 2020-09-03 NOTE — Telephone Encounter (Signed)
Return call from Dr. Alan Ripper nurse reporting that he is insistent that Dr. Benay Spice see this patient. Notified new patient scheduler, Seth Bake and referral placed on MD desk for appointment date/time.

## 2020-09-03 NOTE — Telephone Encounter (Signed)
Mr. Fortner has been cld and rescheduled to see Dr. Benay Spice on 5/5 at 1:40pm. Pt aware to arrive 20 minutes early.

## 2020-09-04 ENCOUNTER — Inpatient Hospital Stay: Payer: BC Managed Care – PPO | Attending: Oncology | Admitting: Oncology

## 2020-09-04 ENCOUNTER — Other Ambulatory Visit: Payer: Self-pay

## 2020-09-04 ENCOUNTER — Ambulatory Visit: Payer: BC Managed Care – PPO | Admitting: Oncology

## 2020-09-04 DIAGNOSIS — C6292 Malignant neoplasm of left testis, unspecified whether descended or undescended: Secondary | ICD-10-CM | POA: Insufficient documentation

## 2020-09-04 DIAGNOSIS — Z9079 Acquired absence of other genital organ(s): Secondary | ICD-10-CM | POA: Insufficient documentation

## 2020-09-04 DIAGNOSIS — Z87442 Personal history of urinary calculi: Secondary | ICD-10-CM | POA: Insufficient documentation

## 2020-09-04 NOTE — Progress Notes (Signed)
[facility] Patient Consult   Requesting MD: [address], [address] [place],  [place] [address]   Bruce Davidson 40 y.o.  1980-10-18    Reason for Consult: Testicular cancer   HPI: Bruce Davidson underwent a vasectomy procedure in January 2019.  He reports the onset of discomfort in the left testicle beginning 2 to 3 weeks after the vasectomy procedure.  He was evaluated by Dr. Diona Fanti and there was no clear explanation for the pain.  He was referred to general surgery and an inguinal hernia was not appreciated.  The pain became more intense and he saw Dr. Diona Fanti for follow-up on 08/08/2020.  The left testicle felt firm and "heavier "than the right.  An ultrasound revealed a complex mass in the left testicle with cystic and solid components.  The mass measured 3.8 x 2.5 x 3.3 cm.  The right testicle appeared normal.  There was a 0.8 cm epididymal cyst.  He was taken to the operating room on 08/13/2020 for a left inguinal orchiectomy.  The pathology [id number]) revealed a mixed germ cell tumor with embryonal (10%, yolk sac (10%, teratoma (70%), and seminoma (10%) components.  The tumor measured 3.2 cm and was limited to the testis.  Germ cell neoplasia in situ was present.  The resection margins and spermatic cord are negative for tumor.  No lymphovascular invasion.  The tumor was staged as a pT1b mixed germ cell tumor.  The tumor was noted to be composed mostly of teratoma with a 10% immature component.  On 08/10/2020 the AFP returned at 68.1 (less than 6.1), beta-hCG less than 1, and LDH 144 (100-220)  He had repeat tumor markers drawn via the urology office last week and results are pending.  Staging CTs of the chest, abdomen, and pelvis on 08/23/2020 revealed no pathologically enlarged mediastinal, hilar, or axillary nodes.  The liver is unremarkable.  2.9 cm cyst in the interpolar right kidney.  Punctate left renal stones.  No pathologically enlarged  lymph nodes in the abdomen or pelvis.  Left orchiectomy.  No evidence of metastatic disease.   Past Medical History:  Diagnosis Date  . Anxiety, generalized   . History of 2019 novel coronavirus disease (COVID-19)    per pt positive home covid test 01/ 2022, stated mild symptoms that resolved  . History of kidney stones   . Hyperlipidemia   . Mass of left testicle- mixed germ cell tumor  08/13/2020  . Nocturia   . Wears contact lenses     Past Surgical History:  Procedure Laterality Date  . MEDIAL PATELLOFEMORAL LIGAMENT REPAIR Left 2005  . ORCHIECTOMY Left 08/13/2020   Procedure: ORCHIECTOMY;  Surgeon: Franchot Gallo, MD;  Location: [facility];  Service: Urology;  Laterality: Left;  45 MINS  . TONSILLECTOMY  chld    Medications: Reviewed  Allergies: No Known Allergies  Family history: A brother had melanoma.  Social History:   He is lives with his wife and stepson in [place].  He works in a [employer] quality [employer].  He does not use cigarettes.  He reports social alcohol use.  He has been a blood donor.  No transfusion history.  No risk factor for HIV or hepatitis.  He has received COVID vaccines.  ROS:   Positives include: Constant dull ache in the left testis prior to the orchiectomy, nocturia, bilateral (left greater than right) flank discomfort for the past year-mild  A complete ROS was otherwise negative.  Physical Exam:  Blood pressure (!) 141/81, pulse 89, temperature 97.8 F (36.6 C), temperature source Oral, resp. rate 18, height 5\' 11"  (1.803 m), weight 227 lb 11.2 oz (103.3 kg), SpO2 99 %.  HEENT: Neck without mass Lungs: Clear bilaterally Cardiac: Regular rate and rhythm Abdomen: No hepatosplenomegaly, nontender GU: Right testis without mass, left inguinal incision with mild surrounding induration/firmness Vascular: No leg edema Lymph nodes: No cervical, supraclavicular, axillary, or inguinal nodes Neurologic: Alert and  oriented, the motor exam appears intact in the upper and lower extremities bilaterally Skin: No rash Musculoskeletal: No spine tenderness, mild tenderness on percussion at the left flank, no mass.  Discomfort with lateral rotation of the torso at the left flank   LAB:  CBC  Lab Results  Component Value Date   HGB 15.3 09/17/2010   HCT 45.0 09/17/2010        CMP  Lab Results  Component Value Date   NA 139 09/17/2010   K 3.6 09/17/2010   CL 103 09/17/2010   GLUCOSE 111 (H) 09/17/2010   BUN 20 09/17/2010   CREATININE 1.20 09/17/2010     No results found for: CEA1  Imaging:  CT images from 08/23/2020 reviewed with Bruce Davidson   Assessment/Plan:   1. 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 History of left testicular pain secondary to #1 3. History of kidney stones   Disposition:   Bruce Davidson has been diagnosed with testicular cancer.  He has been diagnosed with a nonseminomatous germ cell tumor of the left testicle.  We reviewed the surgery pathology report, prognosis, and treatment options.  I reviewed the staging CT images with Bruce Davidson.  He has been diagnosed with a stage I left testicular cancer.  He has low risk disease.  He has a good prognosis for cure.  We discussed options of active surveillance, adjuvant chemotherapy, and retroperitoneal lymph node dissection.  We discussed potential toxicities associated with chemotherapy and retroperitoneal lymph node dissection.  We discussed the treatment schema for 1 cycle of BEP.  He understands there is a high chance of undergoing curative chemotherapy if he developed recurrent disease while on active surveillance.  The postoperative AFP  level is pending.  We will follow up on this within the next 1-2 days.  Bruce Davidson will consider options of active surveillance versus a cycle of BEP.  He will return for an office visit and further discussion on 09/12/2020.  Betsy Coder, MD  09/04/2020, 4:02 PM

## 2020-09-06 ENCOUNTER — Ambulatory Visit: Payer: BC Managed Care – PPO | Admitting: Oncology

## 2020-09-12 ENCOUNTER — Inpatient Hospital Stay: Payer: BC Managed Care – PPO | Admitting: Oncology

## 2020-09-12 ENCOUNTER — Other Ambulatory Visit: Payer: Self-pay

## 2020-09-12 ENCOUNTER — Inpatient Hospital Stay: Payer: BC Managed Care – PPO

## 2020-09-12 ENCOUNTER — Encounter: Payer: Self-pay | Admitting: Oncology

## 2020-09-12 VITALS — BP 144/89 | HR 78 | Temp 97.7°F | Resp 19 | Ht 71.0 in | Wt 225.6 lb

## 2020-09-12 DIAGNOSIS — C6292 Malignant neoplasm of left testis, unspecified whether descended or undescended: Secondary | ICD-10-CM | POA: Diagnosis not present

## 2020-09-12 DIAGNOSIS — Z87442 Personal history of urinary calculi: Secondary | ICD-10-CM | POA: Diagnosis not present

## 2020-09-12 DIAGNOSIS — Z9079 Acquired absence of other genital organ(s): Secondary | ICD-10-CM | POA: Diagnosis not present

## 2020-09-12 LAB — CBC WITH DIFFERENTIAL (CANCER CENTER ONLY)
Abs Immature Granulocytes: 0.01 10*3/uL (ref 0.00–0.07)
Basophils Absolute: 0 10*3/uL (ref 0.0–0.1)
Basophils Relative: 0 %
Eosinophils Absolute: 0.1 10*3/uL (ref 0.0–0.5)
Eosinophils Relative: 3 %
HCT: 43.3 % (ref 39.0–52.0)
Hemoglobin: 14.8 g/dL (ref 13.0–17.0)
Immature Granulocytes: 0 %
Lymphocytes Relative: 27 %
Lymphs Abs: 1.6 10*3/uL (ref 0.7–4.0)
MCH: 32 pg (ref 26.0–34.0)
MCHC: 34.2 g/dL (ref 30.0–36.0)
MCV: 93.7 fL (ref 80.0–100.0)
Monocytes Absolute: 0.5 10*3/uL (ref 0.1–1.0)
Monocytes Relative: 10 %
Neutro Abs: 3.4 10*3/uL (ref 1.7–7.7)
Neutrophils Relative %: 60 %
Platelet Count: 282 10*3/uL (ref 150–400)
RBC: 4.62 MIL/uL (ref 4.22–5.81)
RDW: 12 % (ref 11.5–15.5)
WBC Count: 5.7 10*3/uL (ref 4.0–10.5)
nRBC: 0 % (ref 0.0–0.2)

## 2020-09-12 LAB — CMP (CANCER CENTER ONLY)
ALT: 22 U/L (ref 0–44)
AST: 16 U/L (ref 15–41)
Albumin: 4.4 g/dL (ref 3.5–5.0)
Alkaline Phosphatase: 51 U/L (ref 38–126)
Anion gap: 6 (ref 5–15)
BUN: 15 mg/dL (ref 6–20)
CO2: 27 mmol/L (ref 22–32)
Calcium: 9.3 mg/dL (ref 8.9–10.3)
Chloride: 105 mmol/L (ref 98–111)
Creatinine: 0.81 mg/dL (ref 0.61–1.24)
GFR, Estimated: >60 mL/min (ref >60)
Glucose, Bld: 107 mg/dL — ABNORMAL HIGH (ref 70–99)
Potassium: 4 mmol/L (ref 3.5–5.1)
Sodium: 138 mmol/L (ref 135–145)
Total Bilirubin: 0.5 mg/dL (ref 0.3–1.2)
Total Protein: 7 g/dL (ref 6.5–8.1)

## 2020-09-12 NOTE — Progress Notes (Signed)
  [place] [facility] OFFICE PROGRESS NOTE   Diagnosis: Testicular cancer  INTERVAL HISTORY:   Mr. Yehle returns as scheduled.  He continues to have soreness and swelling at the left inguinal region.  He is taking tramadol as needed.  Objective:  Vital signs in last 24 hours:  Blood pressure (!) 144/89, pulse 78, temperature 97.7 F (36.5 C), temperature source Oral, resp. rate 19, height 5\' 11"  (1.803 m), weight 225 lb 9.6 oz (102.3 kg), SpO2 97 %.     GI: Mild swelling and firm induration surrounding the left inguinal incision.  No erythema or drainage.  The incision appears healed.   Lab Results:  Lab Results  Component Value Date   WBC 5.7 09/12/2020   HGB 14.8 09/12/2020   HCT 43.3 09/12/2020   MCV 93.7 09/12/2020   PLT 282 09/12/2020   NEUTROABS 3.4 09/12/2020    CMP  Lab Results  Component Value Date   NA 138 09/12/2020   K 4.0 09/12/2020   CL 105 09/12/2020   CO2 27 09/12/2020   GLUCOSE 107 (H) 09/12/2020   BUN 15 09/12/2020   CREATININE 0.81 09/12/2020   CALCIUM 9.3 09/12/2020   PROT 7.0 09/12/2020   ALBUMIN 4.4 09/12/2020   AST 16 09/12/2020   ALT 22 09/12/2020   ALKPHOS 51 09/12/2020   BILITOT 0.5 09/12/2020   GFRNONAA >60 09/12/2020  AFP on 09/06/2020: 8.5 (less than 6.1)   Medications: I have reviewed the patient's current medications.   Assessment/Plan: 1. 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 History of left testicular pain secondary to #1 3. History of kidney stones    Disposition: Mr. Mazzola appears stable.  He continues to have discomfort and swelling at the left groin.  This should  improve over the next several weeks.  The AFP remained mildly elevated on 08/10/2020.  This is likely secondary to the prolonged half-life of AFP.  We repeated an AFP level today.  We discussed observation versus 1 cycle of BEP.  He is undecided on a treatment plan.  He would like to wait until the repeat AFP is available and for further discussion with his wife.  We reviewed potential toxicities associated with the BEP regimen including the chance of nausea/vomiting, mucositis, alopecia, and hematologic toxicity.  We discussed the ototoxicity, nephrotoxicity, and neuropathy associated with cisplatin.  We discussed the allergic reaction and rare secondary leukemia seen with etoposide.  We discussed the allergic reaction, rash, phlebitis, and pneumonitis associated with bleomycin.  He would like to return for further discussion on 09/20/2020.  Betsy Coder, MD  09/12/2020  1:55 PM

## 2020-09-13 ENCOUNTER — Telehealth: Payer: Self-pay | Admitting: *Deleted

## 2020-09-13 LAB — AFP TUMOR MARKER: AFP, Serum, Tumor Marker: 3.2 ng/mL (ref 0.0–6.9)

## 2020-09-13 NOTE — Telephone Encounter (Signed)
Per Dr.Sherrill, called to make pt aware of message below. Pt appreciative and verbalized understanding.

## 2020-09-13 NOTE — Telephone Encounter (Signed)
-----   Message from Ladell Pier, MD sent at 09/13/2020  1:36 PM EDT ----- Please call patient, AFP tumor marker is normal, follow-up as scheduled

## 2020-09-20 ENCOUNTER — Inpatient Hospital Stay: Payer: BC Managed Care – PPO | Admitting: Oncology

## 2020-09-20 ENCOUNTER — Other Ambulatory Visit: Payer: Self-pay

## 2020-09-20 VITALS — BP 136/79 | HR 85 | Temp 98.0°F | Resp 18 | Ht 71.0 in | Wt 225.8 lb

## 2020-09-20 DIAGNOSIS — Z9079 Acquired absence of other genital organ(s): Secondary | ICD-10-CM | POA: Diagnosis not present

## 2020-09-20 DIAGNOSIS — C6292 Malignant neoplasm of left testis, unspecified whether descended or undescended: Secondary | ICD-10-CM

## 2020-09-20 DIAGNOSIS — Z87442 Personal history of urinary calculi: Secondary | ICD-10-CM | POA: Diagnosis not present

## 2020-09-20 NOTE — Progress Notes (Signed)
  [place] OFFICE PROGRESS NOTE   Diagnosis: Testicular cancer  INTERVAL HISTORY:   [name] returns as scheduled.  Here today with his wife.  The left inguinal discomfort has improved.  He is scheduled to see urology tomorrow.  He is here for further discussion regarding treatment of the testicular cancer.   Objective:  Vital signs in last 24 hours:  Blood pressure 136/79, pulse 85, temperature 98 F (36.7 C), temperature source Oral, resp. rate 18, height 5\' 11"  (1.803 m), weight 225 lb 12.8 oz (102.4 kg), SpO2 99 %.     Lab Results:  Lab Results  Component Value Date   WBC 5.7 09/12/2020   HGB 14.8 09/12/2020   HCT 43.3 09/12/2020   MCV 93.7 09/12/2020   PLT 282 09/12/2020   NEUTROABS 3.4 09/12/2020    CMP  Lab Results  Component Value Date   NA 138 09/12/2020   K 4.0 09/12/2020   CL 105 09/12/2020   CO2 27 09/12/2020   GLUCOSE 107 (H) 09/12/2020   BUN 15 09/12/2020   CREATININE 0.81 09/12/2020   CALCIUM 9.3 09/12/2020   PROT 7.0 09/12/2020   ALBUMIN 4.4 09/12/2020   AST 16 09/12/2020   ALT 22 09/12/2020   ALKPHOS 51 09/12/2020   BILITOT 0.5 09/12/2020   GFRNONAA >60 09/12/2020    Medications: I have reviewed the patient's current medications.   Assessment/Plan: 1. 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 History of left testicular pain secondary to #1 3. History of kidney stones    Disposition: [name] has been diagnosed with stage I nonseminomatous testicular cancer.  The AFP has normalized.  We discussed treatment options today including surveillance, BEP  chemotherapy, and a retroperitoneal lymph node dissection.  He does not wish to consider surgery.  We discussed the surveillance schedule and details of the BEP chemotherapy regimen.  He is here today with his wife.  [name] has decided to proceed with active surveillance.  He will return in 4-5 weeks for repeat tumor markers and physical examination.  He will be scheduled for a chest x-ray and a surveillance CT of the abdomen/pelvis for approximately 2 months later.  He will continue thinking about his options.  He knows to contact us if he has further questions or changes his decision.  Betsy Coder, MD  09/20/2020  9:55 AM

## 2020-09-21 DIAGNOSIS — C6212 Malignant neoplasm of descended left testis: Secondary | ICD-10-CM | POA: Diagnosis not present

## 2020-09-27 DIAGNOSIS — Z23 Encounter for immunization: Secondary | ICD-10-CM | POA: Diagnosis not present

## 2020-10-17 ENCOUNTER — Inpatient Hospital Stay: Payer: BC Managed Care – PPO | Attending: Oncology

## 2020-10-17 ENCOUNTER — Other Ambulatory Visit: Payer: Self-pay

## 2020-10-17 DIAGNOSIS — C6292 Malignant neoplasm of left testis, unspecified whether descended or undescended: Secondary | ICD-10-CM | POA: Insufficient documentation

## 2020-10-17 LAB — CMP (CANCER CENTER ONLY)
ALT: 21 U/L (ref 0–44)
AST: 17 U/L (ref 15–41)
Albumin: 4.2 g/dL (ref 3.5–5.0)
Alkaline Phosphatase: 57 U/L (ref 38–126)
Anion gap: 7 (ref 5–15)
BUN: 15 mg/dL (ref 6–20)
CO2: 26 mmol/L (ref 22–32)
Calcium: 9.3 mg/dL (ref 8.9–10.3)
Chloride: 106 mmol/L (ref 98–111)
Creatinine: 0.88 mg/dL (ref 0.61–1.24)
GFR, Estimated: >60 mL/min (ref >60)
Glucose, Bld: 97 mg/dL (ref 70–99)
Potassium: 4.1 mmol/L (ref 3.5–5.1)
Sodium: 139 mmol/L (ref 135–145)
Total Bilirubin: 0.8 mg/dL (ref 0.3–1.2)
Total Protein: 6.9 g/dL (ref 6.5–8.1)

## 2020-10-17 LAB — LACTATE DEHYDROGENASE: LDH: 147 U/L (ref 98–192)

## 2020-10-18 LAB — AFP TUMOR MARKER: AFP, Serum, Tumor Marker: 2.4 ng/mL (ref 0.0–6.9)

## 2020-10-18 LAB — BETA HCG QUANT (REF LAB): hCG Quant: <1 m[IU]/mL (ref 0–3)

## 2020-10-19 ENCOUNTER — Other Ambulatory Visit: Payer: Self-pay

## 2020-10-19 ENCOUNTER — Inpatient Hospital Stay (HOSPITAL_BASED_OUTPATIENT_CLINIC_OR_DEPARTMENT_OTHER): Payer: BC Managed Care – PPO | Admitting: Oncology

## 2020-10-19 VITALS — BP 142/83 | HR 83 | Temp 98.3°F | Resp 20 | Ht 71.0 in | Wt 223.4 lb

## 2020-10-19 DIAGNOSIS — C6292 Malignant neoplasm of left testis, unspecified whether descended or undescended: Secondary | ICD-10-CM | POA: Diagnosis not present

## 2020-10-19 NOTE — Progress Notes (Signed)
  [facility] OFFICE PROGRESS NOTE   Diagnosis: Testicular cancer  INTERVAL HISTORY:   Mr. Falkner returns as scheduled.  He feels well.  He is working.  He has started exercising.  The groin wound discomfort has improved.  Objective:  Vital signs in last 24 hours:  Blood pressure (!) 142/83, pulse 83, temperature 98.3 F (36.8 C), temperature source Oral, resp. rate 20, height 5\' 11"  (1.803 m), weight 223 lb 6.4 oz (101.3 kg), SpO2 99 %.    Lymphatics: No cervical, supraclavicular, axillary, or inguinal nodes Resp: Lungs clear bilaterally Cardio: Regular rate and rhythm GI: No hepatosplenomegaly, no mass, nontender.  The left inguinal incision has healed.  Minimal soft fullness inferior to the incision. Vascular: No leg edema    Lab Results:  Lab Results  Component Value Date   WBC 5.7 09/12/2020   HGB 14.8 09/12/2020   HCT 43.3 09/12/2020   MCV 93.7 09/12/2020   PLT 282 09/12/2020   NEUTROABS 3.4 09/12/2020    CMP  Lab Results  Component Value Date   NA 139 10/17/2020   K 4.1 10/17/2020   CL 106 10/17/2020   CO2 26 10/17/2020   GLUCOSE 97 10/17/2020   BUN 15 10/17/2020   CREATININE 0.88 10/17/2020   CALCIUM 9.3 10/17/2020   PROT 6.9 10/17/2020   ALBUMIN 4.2 10/17/2020   AST 17 10/17/2020   ALT 21 10/17/2020   ALKPHOS 57 10/17/2020   BILITOT 0.8 10/17/2020   GFRNONAA >60 10/17/2020   hCG less than 1, AFP 2.4, LDH 147  Medications: I have reviewed the patient's current medications.   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 Brinkmeyer is in clinical remission from testicular cancer.  The serum tumor markers returned normal this week.  He will return for an office visit, repeat serum tumor markers, chest x-ray, and abdomen/pelvis CT in 2 months.  Betsy Coder, MD  10/19/2020  8:49 AM

## 2020-11-12 ENCOUNTER — Telehealth: Payer: Self-pay | Admitting: *Deleted

## 2020-11-12 NOTE — Telephone Encounter (Signed)
Notified patient that his August CT has been scheduled. He was boarding a plane and requested the information go on Mychart.

## 2020-11-29 DIAGNOSIS — J069 Acute upper respiratory infection, unspecified: Secondary | ICD-10-CM | POA: Diagnosis not present

## 2020-11-29 DIAGNOSIS — R051 Acute cough: Secondary | ICD-10-CM | POA: Diagnosis not present

## 2020-11-29 DIAGNOSIS — R5383 Other fatigue: Secondary | ICD-10-CM | POA: Diagnosis not present

## 2020-12-01 DIAGNOSIS — J208 Acute bronchitis due to other specified organisms: Secondary | ICD-10-CM | POA: Diagnosis not present

## 2020-12-01 DIAGNOSIS — R051 Acute cough: Secondary | ICD-10-CM | POA: Diagnosis not present

## 2020-12-01 DIAGNOSIS — B9689 Other specified bacterial agents as the cause of diseases classified elsewhere: Secondary | ICD-10-CM | POA: Diagnosis not present

## 2020-12-03 DIAGNOSIS — C6212 Malignant neoplasm of descended left testis: Secondary | ICD-10-CM | POA: Diagnosis not present

## 2020-12-07 DIAGNOSIS — H5712 Ocular pain, left eye: Secondary | ICD-10-CM | POA: Diagnosis not present

## 2020-12-07 DIAGNOSIS — Z1331 Encounter for screening for depression: Secondary | ICD-10-CM | POA: Diagnosis not present

## 2020-12-18 ENCOUNTER — Ambulatory Visit (HOSPITAL_BASED_OUTPATIENT_CLINIC_OR_DEPARTMENT_OTHER)
Admission: RE | Admit: 2020-12-18 | Discharge: 2020-12-18 | Disposition: A | Discharge status: Home / Self Care | Payer: BC Managed Care – PPO | Source: Ambulatory Visit | Attending: Oncology | Admitting: Oncology

## 2020-12-18 ENCOUNTER — Inpatient Hospital Stay: Payer: BC Managed Care – PPO | Attending: Oncology

## 2020-12-18 ENCOUNTER — Other Ambulatory Visit: Payer: Self-pay

## 2020-12-18 ENCOUNTER — Encounter (HOSPITAL_BASED_OUTPATIENT_CLINIC_OR_DEPARTMENT_OTHER): Payer: Self-pay

## 2020-12-18 DIAGNOSIS — C6292 Malignant neoplasm of left testis, unspecified whether descended or undescended: Secondary | ICD-10-CM | POA: Diagnosis not present

## 2020-12-18 DIAGNOSIS — Z87442 Personal history of urinary calculi: Secondary | ICD-10-CM | POA: Diagnosis not present

## 2020-12-18 DIAGNOSIS — Z1389 Encounter for screening for other disorder: Secondary | ICD-10-CM | POA: Diagnosis not present

## 2020-12-18 DIAGNOSIS — Z8547 Personal history of malignant neoplasm of testis: Secondary | ICD-10-CM | POA: Diagnosis not present

## 2020-12-18 LAB — BASIC METABOLIC PANEL - CANCER CENTER ONLY
Anion gap: 8 (ref 5–15)
BUN: 19 mg/dL (ref 6–20)
CO2: 25 mmol/L (ref 22–32)
Calcium: 9.1 mg/dL (ref 8.9–10.3)
Chloride: 103 mmol/L (ref 98–111)
Creatinine: 0.75 mg/dL (ref 0.61–1.24)
GFR, Estimated: >60 mL/min (ref >60)
Glucose, Bld: 81 mg/dL (ref 70–99)
Potassium: 4 mmol/L (ref 3.5–5.1)
Sodium: 136 mmol/L (ref 135–145)

## 2020-12-18 MED ORDER — IOHEXOL 350 MG/ML SOLN
75.0000 mL | Freq: Once | INTRAVENOUS | Status: AC | PRN
  Administered 2020-12-18: 75 mL via INTRAVENOUS

## 2020-12-19 LAB — AFP TUMOR MARKER: AFP, Serum, Tumor Marker: 2.5 ng/mL (ref 0.0–6.9)

## 2020-12-19 LAB — BETA HCG QUANT (REF LAB): hCG Quant: <1 m[IU]/mL (ref 0–3)

## 2020-12-20 ENCOUNTER — Inpatient Hospital Stay: Payer: BC Managed Care – PPO | Admitting: Oncology

## 2020-12-20 ENCOUNTER — Other Ambulatory Visit: Payer: Self-pay

## 2020-12-20 VITALS — BP 140/95 | HR 77 | Temp 97.6°F | Resp 16 | Ht 71.0 in | Wt 228.0 lb

## 2020-12-20 DIAGNOSIS — C6292 Malignant neoplasm of left testis, unspecified whether descended or undescended: Secondary | ICD-10-CM

## 2020-12-20 DIAGNOSIS — Z87442 Personal history of urinary calculi: Secondary | ICD-10-CM | POA: Diagnosis not present

## 2020-12-20 NOTE — Progress Notes (Signed)
[facility] OFFICE PROGRESS NOTE   Diagnosis: Testicular cancer  INTERVAL HISTORY:   Mr. Carrisalez returns as scheduled.  He feels well.  He is working and exercising.  Mild soreness at the left inguinal surgical site.  Objective:  Vital signs in last 24 hours:  Blood pressure (!) 140/95, pulse 77, temperature 97.6 F (36.4 C), resp. rate 16, height '5\' 11"'$  (1.803 m), weight 228 lb (103.4 kg), SpO2 98 %.   Lymphatics: No cervical, supraclavicular, axillary, or inguinal nodes Resp: Lungs clear bilaterally Cardio: Regular rate and rhythm GI: No hepatosplenomegaly, no mass, nontender, left inguinal incision has healed. GU: Right testis without mass Vascular: No leg edema  Lab Results:  Lab Results  Component Value Date   WBC 5.7 09/12/2020   HGB 14.8 09/12/2020   HCT 43.3 09/12/2020   MCV 93.7 09/12/2020   PLT 282 09/12/2020   NEUTROABS 3.4 09/12/2020    CMP  Lab Results  Component Value Date   NA 136 12/18/2020   K 4.0 12/18/2020   CL 103 12/18/2020   CO2 25 12/18/2020   GLUCOSE 81 12/18/2020   BUN 19 12/18/2020   CREATININE 0.75 12/18/2020   CALCIUM 9.1 12/18/2020   PROT 6.9 10/17/2020   ALBUMIN 4.2 10/17/2020   AST 17 10/17/2020   ALT 21 10/17/2020   ALKPHOS 57 10/17/2020   BILITOT 0.8 10/17/2020   GFRNONAA >60 12/18/2020     Imaging:  DG Chest 2 View  Result Date: 12/18/2020 CLINICAL DATA:  History of testicular cancer. EXAM: CHEST - 2 VIEW COMPARISON:  August 16, 2008. FINDINGS: The heart size and mediastinal contours are within normal limits. Both lungs are clear. The visualized skeletal structures are unremarkable. IMPRESSION: No active cardiopulmonary disease. Electronically Signed   By: Marijo Conception M.D.   On: 12/18/2020 13:22   CT ABDOMEN PELVIS W CONTRAST  Result Date: 12/18/2020 CLINICAL DATA:  Left testicular mixed germ cell tumor. Status post left orchiectomy approximately 4 months ago. Surveillance. EXAM: CT ABDOMEN AND PELVIS  WITH CONTRAST TECHNIQUE: Multidetector CT imaging of the abdomen and pelvis was performed using the standard protocol following bolus administration of intravenous contrast. CONTRAST:  50m OMNIPAQUE IOHEXOL 350 MG/ML SOLN COMPARISON:  08/16/2020 from [facility] FINDINGS: Lower Chest: No acute findings. No suspicious pulmonary nodule seen in lung bases. Hepatobiliary: No hepatic masses identified. Gallbladder is unremarkable. No evidence of biliary ductal dilatation. Pancreas:  No mass or inflammatory changes. Spleen: Within normal limits in size and appearance. Adrenals/Urinary Tract: No masses identified. Benign-appearing right renal cyst again noted. No evidence of ureteral calculi or hydronephrosis. Unremarkable unopacified urinary bladder. Stomach/Bowel: No evidence of obstruction, inflammatory process or abnormal fluid collections. Normal appendix visualized. Vascular/Lymphatic: No pathologically enlarged lymph nodes seen within the retroperitoneum or elsewhere within the abdomen or pelvis. No acute vascular findings. Reproductive: Postop changes again noted from prior left orchiectomy. Normal size prostate and seminal vesicles. Other:  None. Musculoskeletal:  No suspicious bone lesions identified. IMPRESSION: Stable exam. No evidence of abdominal or pelvic metastatic disease or other significant abnormality. Electronically Signed   By: JMarlaine HindM.D.   On: 12/18/2020 10:42    Medications: I have reviewed the patient's current medications.   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 Stefko is in clinical remission from testicular cancer.  The plan is to continue active surveillance.  He will return for a physical exam and tumor markers in 2 months.  We will plan for a chest x-ray and CT in 6 months.  Betsy Coder, MD  12/20/2020  8:21 AM

## 2021-02-14 DIAGNOSIS — D225 Melanocytic nevi of trunk: Secondary | ICD-10-CM | POA: Diagnosis not present

## 2021-02-14 DIAGNOSIS — D2271 Melanocytic nevi of right lower limb, including hip: Secondary | ICD-10-CM | POA: Diagnosis not present

## 2021-02-14 DIAGNOSIS — L814 Other melanin hyperpigmentation: Secondary | ICD-10-CM | POA: Diagnosis not present

## 2021-02-14 DIAGNOSIS — D1801 Hemangioma of skin and subcutaneous tissue: Secondary | ICD-10-CM | POA: Diagnosis not present

## 2021-02-25 ENCOUNTER — Other Ambulatory Visit: Payer: Self-pay

## 2021-02-25 ENCOUNTER — Inpatient Hospital Stay: Payer: BC Managed Care – PPO | Attending: Oncology

## 2021-02-25 DIAGNOSIS — Z87442 Personal history of urinary calculi: Secondary | ICD-10-CM | POA: Insufficient documentation

## 2021-02-25 DIAGNOSIS — C6292 Malignant neoplasm of left testis, unspecified whether descended or undescended: Secondary | ICD-10-CM | POA: Insufficient documentation

## 2021-02-25 LAB — CBC WITH DIFFERENTIAL (CANCER CENTER ONLY)
Abs Immature Granulocytes: 0.01 10*3/uL (ref 0.00–0.07)
Basophils Absolute: 0 10*3/uL (ref 0.0–0.1)
Basophils Relative: 1 %
Eosinophils Absolute: 0.3 10*3/uL (ref 0.0–0.5)
Eosinophils Relative: 6 %
HCT: 43.3 % (ref 39.0–52.0)
Hemoglobin: 14.9 g/dL (ref 13.0–17.0)
Immature Granulocytes: 0 %
Lymphocytes Relative: 40 %
Lymphs Abs: 2 10*3/uL (ref 0.7–4.0)
MCH: 32.3 pg (ref 26.0–34.0)
MCHC: 34.4 g/dL (ref 30.0–36.0)
MCV: 93.9 fL (ref 80.0–100.0)
Monocytes Absolute: 0.6 10*3/uL (ref 0.1–1.0)
Monocytes Relative: 11 %
Neutro Abs: 2.1 10*3/uL (ref 1.7–7.7)
Neutrophils Relative %: 42 %
Platelet Count: 248 10*3/uL (ref 150–400)
RBC: 4.61 MIL/uL (ref 4.22–5.81)
RDW: 12.2 % (ref 11.5–15.5)
WBC Count: 5 10*3/uL (ref 4.0–10.5)
nRBC: 0 % (ref 0.0–0.2)

## 2021-02-25 LAB — LACTATE DEHYDROGENASE: LDH: 165 U/L (ref 98–192)

## 2021-02-28 ENCOUNTER — Inpatient Hospital Stay: Payer: BC Managed Care – PPO | Admitting: Oncology

## 2021-02-28 ENCOUNTER — Other Ambulatory Visit: Payer: Self-pay

## 2021-02-28 ENCOUNTER — Telehealth: Payer: Self-pay

## 2021-02-28 VITALS — BP 136/94 | HR 76 | Temp 98.7°F | Resp 18 | Ht 71.0 in | Wt 227.8 lb

## 2021-02-28 DIAGNOSIS — C6292 Malignant neoplasm of left testis, unspecified whether descended or undescended: Secondary | ICD-10-CM | POA: Diagnosis not present

## 2021-02-28 DIAGNOSIS — Z87442 Personal history of urinary calculi: Secondary | ICD-10-CM | POA: Diagnosis not present

## 2021-02-28 LAB — BETA HCG QUANT (REF LAB): hCG Quant: <1 m[IU]/mL (ref 0–3)

## 2021-02-28 LAB — AFP TUMOR MARKER: AFP, Serum, Tumor Marker: 2.1 ng/mL (ref 0.0–6.9)

## 2021-02-28 NOTE — Telephone Encounter (Signed)
Pt verbalized understanding. Will follow up as scheduled.

## 2021-02-28 NOTE — Progress Notes (Signed)
  [facility] OFFICE PROGRESS NOTE   Diagnosis: Testicular cancer  INTERVAL HISTORY:   Bruce Davidson returns as scheduled.  He feels well.  Good appetite and energy level.  No complaint.  Objective:  Vital signs in last 24 hours:  Blood pressure (!) 136/94, pulse 76, temperature 98.7 F (37.1 C), temperature source Oral, resp. rate 18, height 5\' 11"  (1.803 m), weight 227 lb 12.8 oz (103.3 kg), SpO2 98 %.    Lymphatics: No cervical, supraclavicular, axillary, or inguinal nodes Resp: Lungs clear bilaterally Cardio: Regular rate and rhythm GI: No hepatosplenomegaly, no mass, nontender Vascular: No leg edema GU: Left scrotum unremarkable, right testis without mass, scar tissue versus epididymis at the superior aspect of the right testicle  Portacath/PICC-without erythema  Lab Results:  Lab Results  Component Value Date   WBC 5.0 02/25/2021   HGB 14.9 02/25/2021   HCT 43.3 02/25/2021   MCV 93.9 02/25/2021   PLT 248 02/25/2021   NEUTROABS 2.1 02/25/2021    CMP  Lab Results  Component Value Date   NA 136 12/18/2020   K 4.0 12/18/2020   CL 103 12/18/2020   CO2 25 12/18/2020   GLUCOSE 81 12/18/2020   BUN 19 12/18/2020   CREATININE 0.75 12/18/2020   CALCIUM 9.1 12/18/2020   PROT 6.9 10/17/2020   ALBUMIN 4.2 10/17/2020   AST 17 10/17/2020   ALT 21 10/17/2020   ALKPHOS 57 10/17/2020   BILITOT 0.8 10/17/2020   GFRNONAA >60 12/18/2020  02/25/2021: [id number]-[id number]   Medications: I have reviewed the patient's current medications.   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  The plan is to continue surveillance.  We will follow-up on the AFP and hCG from earlier this week.  He will return for an office and lab visit in 2 months.  He will be scheduled for repeat imaging in February.  Betsy Coder, MD  02/28/2021  8:53 AM

## 2021-02-28 NOTE — Telephone Encounter (Signed)
-----   Message from Ladell Pier, MD sent at 02/28/2021 11:27 AM EDT ----- Please call patient, tumor markers are normal, follow-up as scheduled

## 2021-03-06 ENCOUNTER — Telehealth: Payer: Self-pay

## 2021-03-06 NOTE — Telephone Encounter (Signed)
V/M message from Pt stating he saw Dr Benay Spice for a visit the other day and would like to see if he could have a biopsy or ultrasound of his testicles. Dr. Benay Spice informed.

## 2021-03-08 ENCOUNTER — Telehealth: Payer: Self-pay

## 2021-03-08 NOTE — Telephone Encounter (Signed)
Relayed message to Pt. Pt verbalized understanding.

## 2021-03-08 NOTE — Telephone Encounter (Signed)
-----   Message from Ladell Pier, MD sent at 03/07/2021  6:58 AM EDT ----- Regarding: RE: Biopsy,Ultrasound? I did not palpate anything concerning.  Do not recommend [place] at present. He can f/u with Dr. Diona Fanti if he notes a palpable concern.  Will plan to examine testicle at next visit ----- Message ----- From: Kelli Hope, LPN Sent: 63/0/1601   5:58 PM EDT To: Ladell Pier, MD Subject: Biopsy,Ultrasound?                             Hi Dr Benay Spice above Pt called stated after seeing you the other day he would like to have a biopsy or ultrasound of his testicles. Please advise.

## 2021-03-13 DIAGNOSIS — C6212 Malignant neoplasm of descended left testis: Secondary | ICD-10-CM | POA: Diagnosis not present

## 2021-03-13 DIAGNOSIS — N503 Cyst of epididymis: Secondary | ICD-10-CM | POA: Diagnosis not present

## 2021-05-09 ENCOUNTER — Other Ambulatory Visit: Payer: Self-pay | Admitting: *Deleted

## 2021-05-09 ENCOUNTER — Inpatient Hospital Stay: Payer: BC Managed Care – PPO | Attending: Oncology

## 2021-05-09 ENCOUNTER — Inpatient Hospital Stay (HOSPITAL_BASED_OUTPATIENT_CLINIC_OR_DEPARTMENT_OTHER): Payer: BC Managed Care – PPO | Admitting: Oncology

## 2021-05-09 ENCOUNTER — Other Ambulatory Visit: Payer: Self-pay

## 2021-05-09 VITALS — BP 146/93 | HR 92 | Temp 98.0°F | Resp 18 | Ht 71.0 in | Wt 227.0 lb

## 2021-05-09 DIAGNOSIS — C6292 Malignant neoplasm of left testis, unspecified whether descended or undescended: Secondary | ICD-10-CM

## 2021-05-09 DIAGNOSIS — Z8547 Personal history of malignant neoplasm of testis: Secondary | ICD-10-CM | POA: Diagnosis not present

## 2021-05-09 DIAGNOSIS — Z9079 Acquired absence of other genital organ(s): Secondary | ICD-10-CM | POA: Diagnosis not present

## 2021-05-09 LAB — BASIC METABOLIC PANEL - CANCER CENTER ONLY
Anion gap: 12 (ref 5–15)
BUN: 17 mg/dL (ref 6–20)
CO2: 22 mmol/L (ref 22–32)
Calcium: 9.1 mg/dL (ref 8.9–10.3)
Chloride: 104 mmol/L (ref 98–111)
Creatinine: 0.89 mg/dL (ref 0.61–1.24)
GFR, Estimated: >60 mL/min (ref >60)
Glucose, Bld: 98 mg/dL (ref 70–99)
Potassium: 3.8 mmol/L (ref 3.5–5.1)
Sodium: 138 mmol/L (ref 135–145)

## 2021-05-09 LAB — LACTATE DEHYDROGENASE: LDH: 155 U/L (ref 98–192)

## 2021-05-09 NOTE — Progress Notes (Signed)
°  [place] OFFICE PROGRESS NOTE   Diagnosis: Testicular cancer  INTERVAL HISTORY:   Bruce Davidson returns as scheduled.  He feels well.  No complaint.  He saw Dr. Diona Fanti in November.  A cyst was noted in the right epididymis.  Objective:  Vital signs in last 24 hours:  Blood pressure (!) 146/93, pulse 92, temperature 98 F (36.7 C), temperature source Oral, resp. rate 18, height 5\' 11"  (1.803 m), weight 227 lb (103 kg), SpO2 100 %.    Lymphatics: No cervical, supraclavicular, axillary, or inguinal nodes Resp: Lungs clear bilaterally Cardio: Regular rate and rhythm GI: No mass, nontender, no hepatosplenomegaly GU: Right testis without mass Vascular: No leg edema   Lab Results:  Lab Results  Component Value Date   WBC 5.0 02/25/2021   HGB 14.9 02/25/2021   HCT 43.3 02/25/2021   MCV 93.9 02/25/2021   PLT 248 02/25/2021   NEUTROABS 2.1 02/25/2021    CMP  Lab Results  Component Value Date   NA 136 12/18/2020   K 4.0 12/18/2020   CL 103 12/18/2020   CO2 25 12/18/2020   GLUCOSE 81 12/18/2020   BUN 19 12/18/2020   CREATININE 0.75 12/18/2020   CALCIUM 9.1 12/18/2020   PROT 6.9 10/17/2020   ALBUMIN 4.2 10/17/2020   AST 17 10/17/2020   ALT 21 10/17/2020   ALKPHOS 57 10/17/2020   BILITOT 0.8 10/17/2020   GFRNONAA >60 12/18/2020     Medications: I have reviewed the patient's current medications.   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  We will follow-up with serum tumor markers from today.  He will be scheduled for surveillance imaging in February.  He will return for an office and lab visit in 3 months. Betsy Coder, MD  05/09/2021  9:42 AM

## 2021-05-10 LAB — BETA HCG QUANT (REF LAB): hCG Quant: <1 m[IU]/mL (ref 0–3)

## 2021-05-10 LAB — AFP TUMOR MARKER: AFP, Serum, Tumor Marker: 2.3 ng/mL (ref 0.0–6.9)

## 2021-06-25 DIAGNOSIS — F4323 Adjustment disorder with mixed anxiety and depressed mood: Secondary | ICD-10-CM | POA: Diagnosis not present

## 2021-06-25 DIAGNOSIS — Z113 Encounter for screening for infections with a predominantly sexual mode of transmission: Secondary | ICD-10-CM | POA: Diagnosis not present

## 2021-06-27 ENCOUNTER — Other Ambulatory Visit: Payer: Self-pay

## 2021-06-27 ENCOUNTER — Encounter (HOSPITAL_BASED_OUTPATIENT_CLINIC_OR_DEPARTMENT_OTHER): Payer: Self-pay

## 2021-06-27 ENCOUNTER — Ambulatory Visit (HOSPITAL_BASED_OUTPATIENT_CLINIC_OR_DEPARTMENT_OTHER)
Admission: RE | Admit: 2021-06-27 | Discharge: 2021-06-27 | Disposition: A | Discharge status: Home / Self Care | Payer: BC Managed Care – PPO | Source: Ambulatory Visit | Attending: Oncology | Admitting: Oncology

## 2021-06-27 DIAGNOSIS — C6292 Malignant neoplasm of left testis, unspecified whether descended or undescended: Secondary | ICD-10-CM | POA: Insufficient documentation

## 2021-06-27 DIAGNOSIS — N281 Cyst of kidney, acquired: Secondary | ICD-10-CM | POA: Diagnosis not present

## 2021-06-27 DIAGNOSIS — N401 Enlarged prostate with lower urinary tract symptoms: Secondary | ICD-10-CM | POA: Diagnosis not present

## 2021-06-27 MED ORDER — IOHEXOL 300 MG/ML  SOLN
85.0000 mL | Freq: Once | INTRAMUSCULAR | Status: AC | PRN
  Administered 2021-06-27: 85 mL via INTRAVENOUS

## 2021-06-27 MED ORDER — IOHEXOL 300 MG/ML  SOLN: 85.0000 mL | Freq: Once | INTRAMUSCULAR | Status: DC | PRN

## 2021-07-26 DIAGNOSIS — F4323 Adjustment disorder with mixed anxiety and depressed mood: Secondary | ICD-10-CM | POA: Diagnosis not present

## 2021-08-06 ENCOUNTER — Inpatient Hospital Stay: Payer: BC Managed Care – PPO | Attending: Oncology

## 2021-08-06 ENCOUNTER — Ambulatory Visit (HOSPITAL_BASED_OUTPATIENT_CLINIC_OR_DEPARTMENT_OTHER)
Admission: RE | Admit: 2021-08-06 | Discharge: 2021-08-06 | Disposition: A | Discharge status: Home / Self Care | Payer: BC Managed Care – PPO | Source: Ambulatory Visit | Attending: Oncology | Admitting: Oncology

## 2021-08-06 ENCOUNTER — Inpatient Hospital Stay: Payer: BC Managed Care – PPO | Admitting: Oncology

## 2021-08-06 VITALS — BP 130/78 | HR 65 | Temp 97.7°F | Resp 18 | Ht 71.0 in | Wt 196.0 lb

## 2021-08-06 DIAGNOSIS — C6292 Malignant neoplasm of left testis, unspecified whether descended or undescended: Secondary | ICD-10-CM | POA: Insufficient documentation

## 2021-08-06 DIAGNOSIS — Z0389 Encounter for observation for other suspected diseases and conditions ruled out: Secondary | ICD-10-CM | POA: Diagnosis not present

## 2021-08-06 LAB — LACTATE DEHYDROGENASE: LDH: 147 U/L (ref 98–192)

## 2021-08-06 NOTE — Progress Notes (Signed)
?  [facility] ?OFFICE PROGRESS NOTE ? ? ?Diagnosis: Testicular cancer ? ?INTERVAL HISTORY:  ? ?Bruce Davidson returns as scheduled.  He feels well.  He reports intentional weight loss with exercise and diet.  Good appetite.  He is working. ? ? ?Objective: ? ?Vital signs in last 24 hours: ? ?Blood pressure 130/78, pulse 65, temperature 97.7 ?F (36.5 ?C), temperature source Oral, resp. rate 18, height '5\' 11"'$  (1.803 m), weight 196 lb (88.9 kg), SpO2 98 %. ?  ? ?HEENT: Neck without mass ?Lymphatics: No cervical, supraclavicular, axillary, or inguinal nodes ?Resp: Lungs clear bilaterally ?Cardio: Regular rate and rhythm ?GI: No mass, nontender, no hepatosplenomegaly ?Vascular: No leg edema ?GU: Nodule at the superior right testicle appears outside of the testicle consistent with an epididymal cyst ? ?Lab Results: ? ?Lab Results  ?Component Value Date  ? WBC 5.0 02/25/2021  ? HGB 14.9 02/25/2021  ? HCT 43.3 02/25/2021  ? MCV 93.9 02/25/2021  ? PLT 248 02/25/2021  ? NEUTROABS 2.1 02/25/2021  ? ? ?CMP  ?Lab Results  ?Component Value Date  ? NA 138 05/09/2021  ? K 3.8 05/09/2021  ? CL 104 05/09/2021  ? CO2 22 05/09/2021  ? GLUCOSE 98 05/09/2021  ? BUN 17 05/09/2021  ? CREATININE 0.89 05/09/2021  ? CALCIUM 9.1 05/09/2021  ? PROT 6.9 10/17/2020  ? ALBUMIN 4.2 10/17/2020  ? AST 17 10/17/2020  ? ALT 21 10/17/2020  ? ALKPHOS 57 10/17/2020  ? BILITOT 0.8 10/17/2020  ? GFRNONAA >60 05/09/2021  ? ? ?Medications: I have reviewed the patient's current medications. ? ? 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, pNx,M0 ?Left inguinal orchiectomy 08/13/2020- tumor confined to the testis, no lymphovascular invasion ?CTs 08/23/2020-no evidence of metastatic disease, minimal peribronchovascular groundglass in the posterior segment right upper lobe, punctate left renal stones ?Elevated preoperative AFP, AFP mildly elevated 08/30/2020, AFP normal on 09/12/2020 ?CT abdomen/pelvis and chest x-ray  12/18/2020-negative ?AFP and hCG 12/18/2020, 02/25/2021-normal, 05/09/2021-normal ?CT abdomen/pelvis 06/27/2021-no evidence of metastatic disease ?History of left testicular pain secondary to #1 ?History of kidney stones ? ? ? ?Disposition: ?Bruce Davidson is in clinical remission from testicular cancer.  He is now 1 year out from diagnosis.  He will be referred for a surveillance chest x-ray today.  He will return for an office visit, tumor markers, and a surveillance CT in August. ? ?Betsy Coder, MD ? ?08/06/2021  ?9:48 AM ? ? ?

## 2021-08-07 LAB — BETA HCG QUANT (REF LAB): hCG Quant: <1 m[IU]/mL (ref 0–3)

## 2021-08-07 LAB — AFP TUMOR MARKER: AFP, Serum, Tumor Marker: 2.2 ng/mL (ref 0.0–6.9)

## 2021-09-10 DIAGNOSIS — E785 Hyperlipidemia, unspecified: Secondary | ICD-10-CM | POA: Diagnosis not present

## 2021-09-10 DIAGNOSIS — Z125 Encounter for screening for malignant neoplasm of prostate: Secondary | ICD-10-CM | POA: Diagnosis not present

## 2021-09-17 DIAGNOSIS — Z0000 Encounter for general adult medical examination without abnormal findings: Secondary | ICD-10-CM | POA: Diagnosis not present

## 2021-09-17 DIAGNOSIS — Z1331 Encounter for screening for depression: Secondary | ICD-10-CM | POA: Diagnosis not present

## 2021-09-17 DIAGNOSIS — R030 Elevated blood-pressure reading, without diagnosis of hypertension: Secondary | ICD-10-CM | POA: Diagnosis not present

## 2021-09-17 DIAGNOSIS — S67190A Crushing injury of right index finger, initial encounter: Secondary | ICD-10-CM | POA: Diagnosis not present

## 2021-09-17 DIAGNOSIS — Z1339 Encounter for screening examination for other mental health and behavioral disorders: Secondary | ICD-10-CM | POA: Diagnosis not present

## 2021-12-05 ENCOUNTER — Inpatient Hospital Stay: Payer: BC Managed Care – PPO | Attending: Oncology

## 2021-12-05 ENCOUNTER — Ambulatory Visit (HOSPITAL_BASED_OUTPATIENT_CLINIC_OR_DEPARTMENT_OTHER)
Admission: RE | Admit: 2021-12-05 | Discharge: 2021-12-05 | Disposition: A | Discharge status: Home / Self Care | Payer: BC Managed Care – PPO | Source: Ambulatory Visit | Attending: Oncology | Admitting: Oncology

## 2021-12-05 ENCOUNTER — Encounter (HOSPITAL_BASED_OUTPATIENT_CLINIC_OR_DEPARTMENT_OTHER): Payer: Self-pay

## 2021-12-05 ENCOUNTER — Inpatient Hospital Stay: Payer: BC Managed Care – PPO | Admitting: Oncology

## 2021-12-05 VITALS — BP 139/89 | HR 79 | Temp 98.1°F | Resp 20 | Ht 71.0 in | Wt 195.4 lb

## 2021-12-05 DIAGNOSIS — C6292 Malignant neoplasm of left testis, unspecified whether descended or undescended: Secondary | ICD-10-CM

## 2021-12-05 DIAGNOSIS — Z9079 Acquired absence of other genital organ(s): Secondary | ICD-10-CM | POA: Insufficient documentation

## 2021-12-05 DIAGNOSIS — N2889 Other specified disorders of kidney and ureter: Secondary | ICD-10-CM | POA: Diagnosis not present

## 2021-12-05 DIAGNOSIS — Z8547 Personal history of malignant neoplasm of testis: Secondary | ICD-10-CM | POA: Insufficient documentation

## 2021-12-05 DIAGNOSIS — Z87442 Personal history of urinary calculi: Secondary | ICD-10-CM | POA: Insufficient documentation

## 2021-12-05 DIAGNOSIS — R109 Unspecified abdominal pain: Secondary | ICD-10-CM | POA: Diagnosis not present

## 2021-12-05 LAB — BASIC METABOLIC PANEL - CANCER CENTER ONLY
Anion gap: 8 (ref 5–15)
BUN: 17 mg/dL (ref 6–20)
CO2: 27 mmol/L (ref 22–32)
Calcium: 9.4 mg/dL (ref 8.9–10.3)
Chloride: 103 mmol/L (ref 98–111)
Creatinine: 0.92 mg/dL (ref 0.61–1.24)
GFR, Estimated: >60 mL/min (ref >60)
Glucose, Bld: 91 mg/dL (ref 70–99)
Potassium: 4 mmol/L (ref 3.5–5.1)
Sodium: 138 mmol/L (ref 135–145)

## 2021-12-05 LAB — LACTATE DEHYDROGENASE: LDH: 144 U/L (ref 98–192)

## 2021-12-05 MED ORDER — IOHEXOL 300 MG/ML  SOLN
100.0000 mL | Freq: Once | INTRAMUSCULAR | Status: AC | PRN
  Administered 2021-12-05: 85 mL via INTRAVENOUS

## 2021-12-05 NOTE — Progress Notes (Signed)
  [facility] OFFICE PROGRESS NOTE   Diagnosis: Testicular cancer  INTERVAL HISTORY:   Bruce Davidson returns as scheduled.  He feels well.  He is exercising frequently.  Good appetite.  He reports intentional weight loss.  He has noted discomfort at the bilateral flank area for the past few months.  The pain is mild and not consistent.  No associated symptoms.  Objective:  Vital signs in last 24 hours:  Blood pressure 139/89, pulse 79, temperature 98.1 F (36.7 C), temperature source Oral, resp. rate 20, height '5\' 11"'$  (1.803 m), weight 195 lb 6.4 oz (88.6 kg), SpO2 99 %.     Lymphatics: No cervical, supraclavicular, axillary, or inguinal nodes Resp: Lungs clear bilaterally Cardio: Regular rate and rhythm GI: No hepatosplenomegaly, no mass, nontender Vascular: No leg edema GU: Approximate 1 cm nodule at the superior right testicle-unchanged, testis without mass Musculoskeletal: No flank tenderness or mass   Lab Results:  Lab Results  Component Value Date   WBC 5.0 02/25/2021   HGB 14.9 02/25/2021   HCT 43.3 02/25/2021   MCV 93.9 02/25/2021   PLT 248 02/25/2021   NEUTROABS 2.1 02/25/2021    CMP  Lab Results  Component Value Date   NA 138 12/05/2021   K 4.0 12/05/2021   CL 103 12/05/2021   CO2 27 12/05/2021   GLUCOSE 91 12/05/2021   BUN 17 12/05/2021   CREATININE 0.92 12/05/2021   CALCIUM 9.4 12/05/2021   PROT 6.9 10/17/2020   ALBUMIN 4.2 10/17/2020   AST 17 10/17/2020   ALT 21 10/17/2020   ALKPHOS 57 10/17/2020   BILITOT 0.8 10/17/2020   GFRNONAA >60 12/05/2021     Medications: I have reviewed the patient's current medications.   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, pNx,M0 Left inguinal orchiectomy 08/13/2020- tumor confined to the testis, no lymphovascular invasion CTs 08/23/2020-no evidence of metastatic disease, minimal peribronchovascular groundglass in the posterior segment right upper lobe,  punctate left renal stones Elevated preoperative AFP, AFP mildly elevated 08/30/2020, AFP normal on 09/12/2020 CT abdomen/pelvis and chest x-ray 12/18/2020-negative AFP and hCG 12/18/2020, 02/25/2021-normal, 05/09/2021-normal CT abdomen/pelvis 06/27/2021-no evidence of metastatic disease Chest x-ray 08/06/2021-no active disease History of left testicular pain secondary to #1 History of kidney stones    Disposition: Bruce Davidson remains in clinical remission from testicular cancer.  We will follow-up on the AFP, hCG, and restaging CT from today.  He will return for an office visit, tumor markers, and a chest x-ray in 3-4 months.  The flank pain is likely related to a benign musculoskeletal condition, potentially associated with his exercise regimen.  Betsy Coder, MD  12/05/2021  11:18 AM

## 2021-12-06 ENCOUNTER — Telehealth: Payer: Self-pay

## 2021-12-06 NOTE — Telephone Encounter (Signed)
Pt verbalized understanding.

## 2021-12-06 NOTE — Telephone Encounter (Signed)
-----   Message from Ladell Pier, MD sent at 12/05/2021  4:24 PM EDT ----- Please call patient, CT shows no evidence of recurrent cancer, follow-up scheduled

## 2021-12-07 LAB — AFP TUMOR MARKER: AFP, Serum, Tumor Marker: 2.9 ng/mL (ref 0.0–6.9)

## 2021-12-07 LAB — BETA HCG QUANT (REF LAB): hCG Quant: <1 m[IU]/mL (ref 0–3)

## 2022-02-24 DIAGNOSIS — L821 Other seborrheic keratosis: Secondary | ICD-10-CM | POA: Diagnosis not present

## 2022-02-24 DIAGNOSIS — L570 Actinic keratosis: Secondary | ICD-10-CM | POA: Diagnosis not present

## 2022-02-24 DIAGNOSIS — L814 Other melanin hyperpigmentation: Secondary | ICD-10-CM | POA: Diagnosis not present

## 2022-02-24 DIAGNOSIS — D485 Neoplasm of uncertain behavior of skin: Secondary | ICD-10-CM | POA: Diagnosis not present

## 2022-02-24 DIAGNOSIS — D225 Melanocytic nevi of trunk: Secondary | ICD-10-CM | POA: Diagnosis not present

## 2022-02-24 DIAGNOSIS — L578 Other skin changes due to chronic exposure to nonionizing radiation: Secondary | ICD-10-CM | POA: Diagnosis not present

## 2022-02-24 DIAGNOSIS — L820 Inflamed seborrheic keratosis: Secondary | ICD-10-CM | POA: Diagnosis not present

## 2022-04-02 ENCOUNTER — Inpatient Hospital Stay: Payer: BC Managed Care – PPO

## 2022-04-03 ENCOUNTER — Inpatient Hospital Stay: Payer: BC Managed Care – PPO | Attending: Oncology

## 2022-04-03 ENCOUNTER — Ambulatory Visit (HOSPITAL_BASED_OUTPATIENT_CLINIC_OR_DEPARTMENT_OTHER)
Admission: RE | Admit: 2022-04-03 | Discharge: 2022-04-03 | Disposition: A | Discharge status: Home / Self Care | Payer: BC Managed Care – PPO | Source: Ambulatory Visit | Attending: Oncology | Admitting: Oncology

## 2022-04-03 DIAGNOSIS — C6292 Malignant neoplasm of left testis, unspecified whether descended or undescended: Secondary | ICD-10-CM | POA: Insufficient documentation

## 2022-04-03 DIAGNOSIS — Z8547 Personal history of malignant neoplasm of testis: Secondary | ICD-10-CM | POA: Insufficient documentation

## 2022-04-03 DIAGNOSIS — R109 Unspecified abdominal pain: Secondary | ICD-10-CM | POA: Diagnosis not present

## 2022-04-03 DIAGNOSIS — Z9079 Acquired absence of other genital organ(s): Secondary | ICD-10-CM | POA: Insufficient documentation

## 2022-04-03 DIAGNOSIS — Z0389 Encounter for observation for other suspected diseases and conditions ruled out: Secondary | ICD-10-CM | POA: Diagnosis not present

## 2022-04-03 DIAGNOSIS — Z87442 Personal history of urinary calculi: Secondary | ICD-10-CM | POA: Insufficient documentation

## 2022-04-03 LAB — LACTATE DEHYDROGENASE: LDH: 155 U/L (ref 98–192)

## 2022-04-05 LAB — AFP TUMOR MARKER: AFP, Serum, Tumor Marker: 3 ng/mL (ref 0.0–6.9)

## 2022-04-05 LAB — BETA HCG QUANT (REF LAB): hCG Quant: <1 m[IU]/mL (ref 0–3)

## 2022-04-09 ENCOUNTER — Inpatient Hospital Stay: Payer: BC Managed Care – PPO | Attending: Oncology | Admitting: Oncology

## 2022-04-09 VITALS — BP 144/94 | HR 65 | Temp 97.6°F | Resp 20 | Ht 71.0 in | Wt 202.0 lb

## 2022-04-09 DIAGNOSIS — Z8547 Personal history of malignant neoplasm of testis: Secondary | ICD-10-CM | POA: Insufficient documentation

## 2022-04-09 DIAGNOSIS — Z9079 Acquired absence of other genital organ(s): Secondary | ICD-10-CM | POA: Insufficient documentation

## 2022-04-09 DIAGNOSIS — C6292 Malignant neoplasm of left testis, unspecified whether descended or undescended: Secondary | ICD-10-CM | POA: Diagnosis not present

## 2022-04-09 DIAGNOSIS — Z87442 Personal history of urinary calculi: Secondary | ICD-10-CM | POA: Diagnosis not present

## 2022-04-09 NOTE — Progress Notes (Signed)
  [facility] OFFICE PROGRESS NOTE   Diagnosis: Testicular cancer  INTERVAL HISTORY:   Bruce Davidson returns as scheduled.  He feels well.  Good appetite.  No pain.  He feels the "cyst "of the right testicle has enlarged.  Objective:  Vital signs in last 24 hours:  Blood pressure (!) 144/94, pulse 65, temperature 97.6 F (36.4 C), temperature source Oral, resp. rate 20, height '5\' 11"'$  (1.803 m), weight 202 lb (91.6 kg), SpO2 97 %.   Lymphatics: No cervical, supraclavicular, axillary, or inguinal nodes Resp: Clear bilaterally Cardio: Regular rate and rhythm GI: No hepatosplenomegaly, no mass, nontender Vascular: No leg edema GU: 1-1.5 cm soft mobile nodule at the superior pole of the right testicle, no     Medications: I have reviewed the patient's current medications.   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, pNx,M0 Left inguinal orchiectomy 08/13/2020- tumor confined to the testis, no lymphovascular invasion CTs 08/23/2020-no evidence of metastatic disease, minimal peribronchovascular groundglass in the posterior segment right upper lobe, punctate left renal stones Elevated preoperative AFP, AFP mildly elevated 08/30/2020, AFP normal on 09/12/2020 CT abdomen/pelvis and chest x-ray 12/18/2020-negative AFP and hCG 12/18/2020, 02/25/2021-normal, 05/09/2021-normal CT abdomen/pelvis 06/27/2021-no evidence of metastatic disease Chest x-ray 08/06/2021-no active disease CT abdomen/pelvis 12/05/2021-no evidence of recurrent disease Chest x-ray 04/03/2022-no active cardiopulmonary disease History of left testicular pain secondary to #1 History of kidney stones    Disposition: Bruce Davidson is in clinical remission from testicular cancer.  The serum tumor markers and chest x-ray revealed no evidence of recurrent disease.  He will return for an office visit, a chest x-ray, and CT abdomen/pelvis in March 2024. The right epididymal cyst appears  unchanged exam today.  He would like to follow-up with Dr. Diona Fanti for repeat examination and possible ultrasound.  He will schedule an appoint with Dr. Diona Fanti.  Betsy Coder, MD  04/09/2022  9:24 AM

## 2022-07-15 ENCOUNTER — Ambulatory Visit (HOSPITAL_BASED_OUTPATIENT_CLINIC_OR_DEPARTMENT_OTHER)
Admission: RE | Admit: 2022-07-15 | Discharge: 2022-07-15 | Disposition: A | Discharge status: Home / Self Care | Payer: BC Managed Care – PPO | Source: Ambulatory Visit | Attending: Oncology | Admitting: Oncology

## 2022-07-15 ENCOUNTER — Inpatient Hospital Stay: Payer: BC Managed Care – PPO | Attending: Oncology

## 2022-07-15 ENCOUNTER — Encounter (HOSPITAL_BASED_OUTPATIENT_CLINIC_OR_DEPARTMENT_OTHER): Payer: Self-pay

## 2022-07-15 DIAGNOSIS — C6290 Malignant neoplasm of unspecified testis, unspecified whether descended or undescended: Secondary | ICD-10-CM | POA: Diagnosis not present

## 2022-07-15 DIAGNOSIS — Z8547 Personal history of malignant neoplasm of testis: Secondary | ICD-10-CM | POA: Diagnosis not present

## 2022-07-15 DIAGNOSIS — Z87442 Personal history of urinary calculi: Secondary | ICD-10-CM | POA: Insufficient documentation

## 2022-07-15 DIAGNOSIS — C6292 Malignant neoplasm of left testis, unspecified whether descended or undescended: Secondary | ICD-10-CM

## 2022-07-15 DIAGNOSIS — R772 Abnormality of alphafetoprotein: Secondary | ICD-10-CM | POA: Insufficient documentation

## 2022-07-15 DIAGNOSIS — Z9079 Acquired absence of other genital organ(s): Secondary | ICD-10-CM | POA: Insufficient documentation

## 2022-07-15 DIAGNOSIS — N281 Cyst of kidney, acquired: Secondary | ICD-10-CM | POA: Diagnosis not present

## 2022-07-15 LAB — BASIC METABOLIC PANEL - CANCER CENTER ONLY
Anion gap: 8 (ref 5–15)
BUN: 17 mg/dL (ref 6–20)
CO2: 25 mmol/L (ref 22–32)
Calcium: 9.6 mg/dL (ref 8.9–10.3)
Chloride: 102 mmol/L (ref 98–111)
Creatinine: 0.99 mg/dL (ref 0.61–1.24)
GFR, Estimated: >60 mL/min (ref >60)
Glucose, Bld: 85 mg/dL (ref 70–99)
Potassium: 3.9 mmol/L (ref 3.5–5.1)
Sodium: 135 mmol/L (ref 135–145)

## 2022-07-15 LAB — LACTATE DEHYDROGENASE: LDH: 137 U/L (ref 98–192)

## 2022-07-15 MED ORDER — IOHEXOL 300 MG/ML  SOLN
100.0000 mL | Freq: Once | INTRAMUSCULAR | Status: AC | PRN
  Administered 2022-07-15: 85 mL via INTRAVENOUS

## 2022-07-16 ENCOUNTER — Ambulatory Visit (HOSPITAL_BASED_OUTPATIENT_CLINIC_OR_DEPARTMENT_OTHER): Payer: BC Managed Care – PPO

## 2022-07-16 ENCOUNTER — Ambulatory Visit (HOSPITAL_BASED_OUTPATIENT_CLINIC_OR_DEPARTMENT_OTHER): Payer: BC Managed Care – PPO | Admitting: Radiology

## 2022-07-16 ENCOUNTER — Inpatient Hospital Stay: Payer: BC Managed Care – PPO

## 2022-07-16 LAB — AFP TUMOR MARKER: AFP, Serum, Tumor Marker: 2.8 ng/mL (ref 0.0–6.9)

## 2022-07-17 LAB — BETA HCG QUANT (REF LAB): hCG Quant: <1 m[IU]/mL (ref 0–3)

## 2022-07-23 ENCOUNTER — Inpatient Hospital Stay: Payer: BC Managed Care – PPO | Admitting: Oncology

## 2022-07-24 ENCOUNTER — Inpatient Hospital Stay: Payer: BC Managed Care – PPO | Admitting: Nurse Practitioner

## 2022-07-24 ENCOUNTER — Encounter: Payer: Self-pay | Admitting: Nurse Practitioner

## 2022-07-24 VITALS — BP 129/85 | HR 75 | Temp 98.1°F | Resp 18 | Ht 71.0 in | Wt 206.8 lb

## 2022-07-24 DIAGNOSIS — Z8547 Personal history of malignant neoplasm of testis: Secondary | ICD-10-CM | POA: Diagnosis not present

## 2022-07-24 DIAGNOSIS — C6292 Malignant neoplasm of left testis, unspecified whether descended or undescended: Secondary | ICD-10-CM | POA: Diagnosis not present

## 2022-07-24 DIAGNOSIS — Z87442 Personal history of urinary calculi: Secondary | ICD-10-CM | POA: Diagnosis not present

## 2022-07-24 DIAGNOSIS — Z9079 Acquired absence of other genital organ(s): Secondary | ICD-10-CM | POA: Diagnosis not present

## 2022-07-24 NOTE — Progress Notes (Signed)
  [place] OFFICE PROGRESS NOTE   Diagnosis: Testicular cancer  INTERVAL HISTORY:   Bruce Davidson returns as scheduled.  He feels well he has a good appetite.  Weight is stable.  He is exercising.  He sleeps well.  He denies pain.  No cough or shortness of breath.  No bowel or bladder issues.  Objective:  Vital signs in last 24 hours:  Blood pressure 129/85, pulse 75, temperature 98.1 F (36.7 C), temperature source Oral, resp. rate 18, height 5\' 11"  (1.803 m), weight 206 lb 12.8 oz (93.8 kg), SpO2 99 %.    Lymphatics: No palpable cervical, supraclavicular, axillary or inguinal lymph nodes. Resp: Lungs clear bilaterally. Cardio: Regular rate and rhythm. GI: Abdomen soft and nontender.  No hepatosplenomegaly. Vascular: No leg edema. GU: Less than 1 cm soft mobile nodule superior pole of the right testicle.  No testicular mass.  Lab Results:  Lab Results  Component Value Date   WBC 5.0 02/25/2021   HGB 14.9 02/25/2021   HCT 43.3 02/25/2021   MCV 93.9 02/25/2021   PLT 248 02/25/2021   NEUTROABS 2.1 02/25/2021    Imaging:  No results found.  Medications: I have reviewed the patient's current medications.  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, pNx,M0 Left inguinal orchiectomy 08/13/2020- tumor confined to the testis, no lymphovascular invasion CTs 08/23/2020-no evidence of metastatic disease, minimal peribronchovascular groundglass in the posterior segment right upper lobe, punctate left renal stones Elevated preoperative AFP, AFP mildly elevated 08/30/2020, AFP normal on 09/12/2020 CT abdomen/pelvis and chest x-ray 12/18/2020-negative AFP and hCG 12/18/2020, 02/25/2021-normal, 05/09/2021-normal, 07/15/2022-normal CT abdomen/pelvis 06/27/2021-no evidence of metastatic disease Chest x-ray 08/06/2021-no active disease CT abdomen/pelvis 12/05/2021-no evidence of recurrent disease Chest x-ray 04/03/2022-no active cardiopulmonary  disease Chest x-ray 07/15/2022-no active cardiopulmonary disease CTs abdomen/pelvis 07/15/2022-post left orchiectomy, no evidence of metastatic disease. History of left testicular pain secondary to #1 History of kidney stones  Disposition: Bruce Davidson appears stable.  He remains in clinical remission from testicular cancer.  Serum tumor markers, chest x-ray, CT scan show no evidence of recurrent disease.  He will continue follow-up of the right epididymal cyst with Dr. Diona Fanti.  Plan for tumor markers and follow-up in 4 months.  Patient seen with Dr. Benay Spice.  CT images reviewed on the computer with Bruce Davidson.   Ned Card ANP/GNP-BC   07/24/2022  9:49 AM  This was a shared visit with Ned Card.  We reviewed the restaging findings with Bruce Davidson.  He remains in clinical remission from testicular cancer.  He will schedule a follow-up appointment with Dr. Diona Fanti to evaluate the right testicular cyst.  I was present for greater than 50% of today's visit.  I performed medical decision making.  Julieanne Manson, MD

## 2022-08-15 IMAGING — DX DG CHEST 2V
2 series · 2 of 2 positions shown · non-contrast
Comparison: 12/18/2020

CLINICAL DATA: 40-year-old male with a history testicular cancer

EXAM:
CHEST - 2 VIEW

[chest pa]
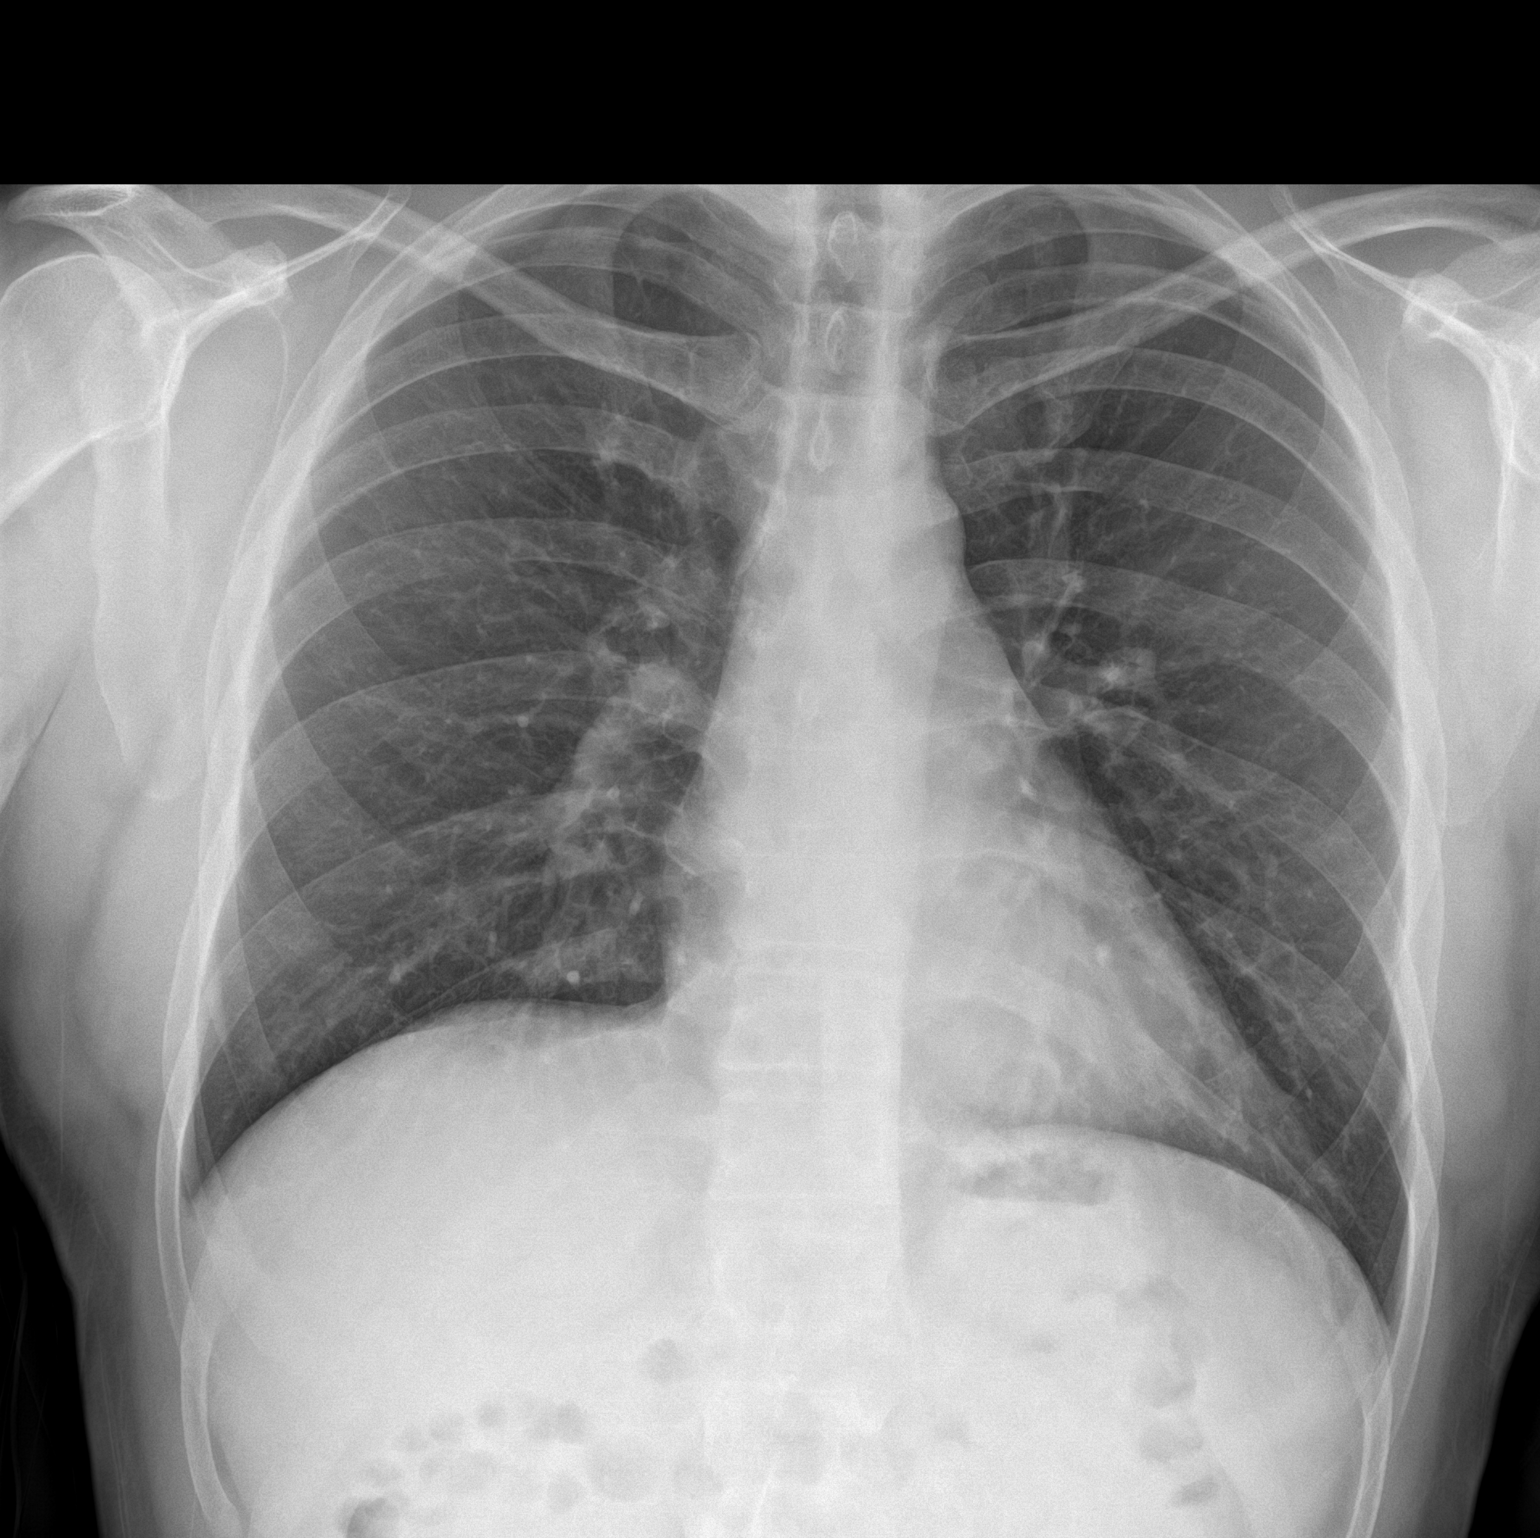

[chest lat]
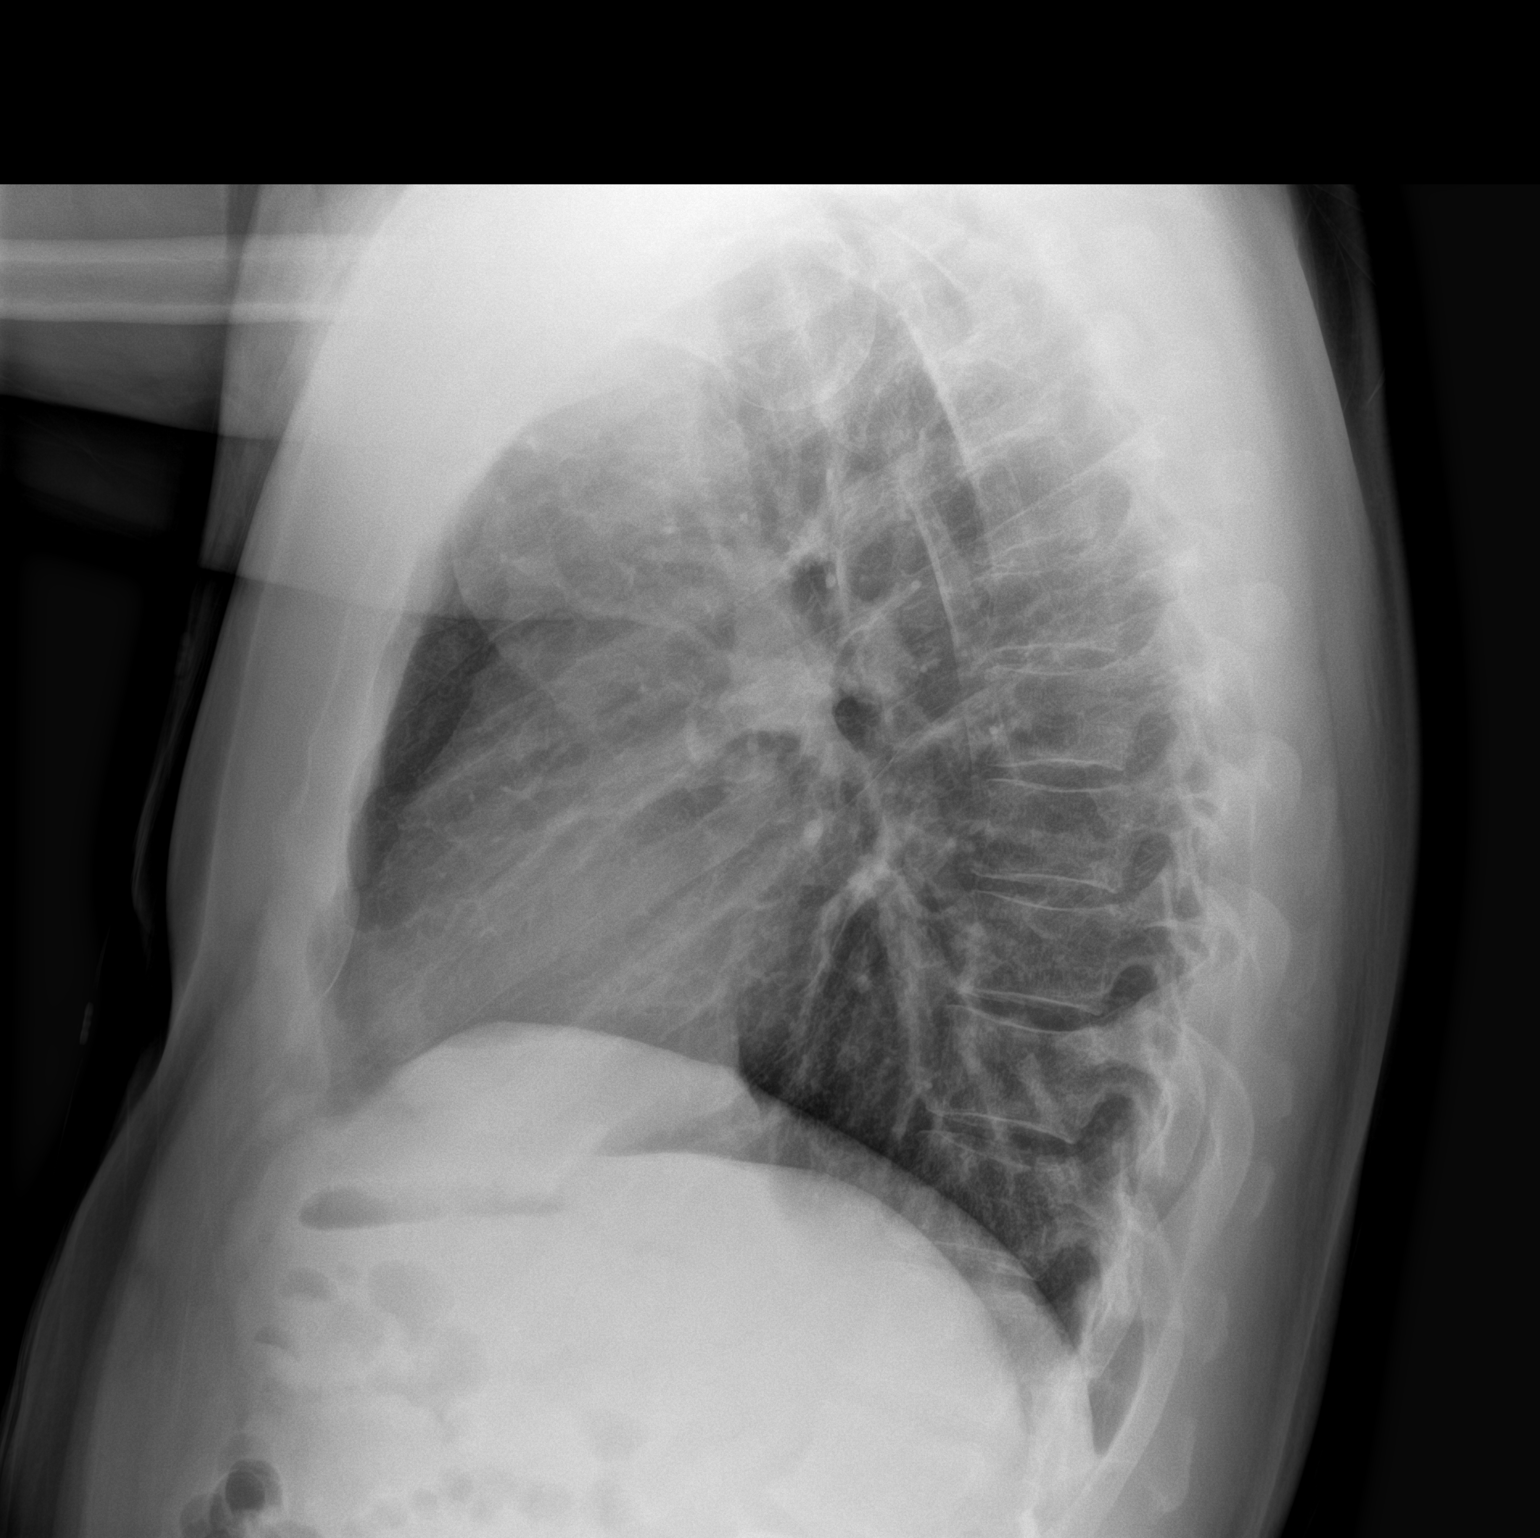

[2 of 2 positions shown; findings below may reference images not displayed]

FINDINGS: Cardiomediastinal silhouette unchanged in size and contour. No
evidence of central vascular congestion. No interlobular septal
thickening. No pneumothorax or pleural effusion. No confluent
airspace disease.

No acute displaced fracture
IMPRESSION: No active cardiopulmonary disease.

## 2022-09-23 DIAGNOSIS — Z0000 Encounter for general adult medical examination without abnormal findings: Secondary | ICD-10-CM | POA: Diagnosis not present

## 2022-09-23 DIAGNOSIS — E785 Hyperlipidemia, unspecified: Secondary | ICD-10-CM | POA: Diagnosis not present

## 2022-09-23 DIAGNOSIS — E669 Obesity, unspecified: Secondary | ICD-10-CM | POA: Diagnosis not present

## 2022-09-30 DIAGNOSIS — E1129 Type 2 diabetes mellitus with other diabetic kidney complication: Secondary | ICD-10-CM | POA: Diagnosis not present

## 2022-09-30 DIAGNOSIS — F4323 Adjustment disorder with mixed anxiety and depressed mood: Secondary | ICD-10-CM | POA: Diagnosis not present

## 2022-09-30 DIAGNOSIS — Z1331 Encounter for screening for depression: Secondary | ICD-10-CM | POA: Diagnosis not present

## 2022-09-30 DIAGNOSIS — Z0000 Encounter for general adult medical examination without abnormal findings: Secondary | ICD-10-CM | POA: Diagnosis not present

## 2022-09-30 DIAGNOSIS — R82998 Other abnormal findings in urine: Secondary | ICD-10-CM | POA: Diagnosis not present

## 2022-09-30 DIAGNOSIS — F411 Generalized anxiety disorder: Secondary | ICD-10-CM | POA: Diagnosis not present

## 2022-09-30 DIAGNOSIS — E785 Hyperlipidemia, unspecified: Secondary | ICD-10-CM | POA: Diagnosis not present

## 2022-09-30 DIAGNOSIS — R030 Elevated blood-pressure reading, without diagnosis of hypertension: Secondary | ICD-10-CM | POA: Diagnosis not present

## 2022-09-30 DIAGNOSIS — Z1339 Encounter for screening examination for other mental health and behavioral disorders: Secondary | ICD-10-CM | POA: Diagnosis not present

## 2022-12-04 ENCOUNTER — Inpatient Hospital Stay: Payer: BC Managed Care – PPO | Admitting: Oncology

## 2022-12-04 ENCOUNTER — Inpatient Hospital Stay: Payer: BC Managed Care – PPO | Attending: Oncology

## 2022-12-04 VITALS — BP 133/84 | HR 97 | Temp 98.2°F | Resp 18 | Ht 71.0 in | Wt 215.6 lb

## 2022-12-04 DIAGNOSIS — Z8547 Personal history of malignant neoplasm of testis: Secondary | ICD-10-CM | POA: Diagnosis not present

## 2022-12-04 DIAGNOSIS — C6292 Malignant neoplasm of left testis, unspecified whether descended or undescended: Secondary | ICD-10-CM

## 2022-12-04 DIAGNOSIS — R772 Abnormality of alphafetoprotein: Secondary | ICD-10-CM | POA: Insufficient documentation

## 2022-12-04 LAB — LACTATE DEHYDROGENASE: LDH: 163 U/L (ref 98–192)

## 2022-12-04 NOTE — Progress Notes (Signed)
  [facility] OFFICE PROGRESS NOTE   Diagnosis: Testicular cancer  INTERVAL HISTORY:   Mr. Bargeron returns as scheduled.  He feels well.  Good appetite and energy level.  No change in the cyst at the right testicle.  No complaint.  Objective:  Vital signs in last 24 hours:  Blood pressure 133/84, pulse 97, temperature 98.2 F (36.8 C), temperature source Oral, resp. rate 18, height 5\' 11"  (1.803 m), weight 215 lb 9.6 oz (97.8 kg), SpO2 98%.    Lymphatics: No cervical, supraclavicular, axillary, or inguinal nodes Resp: Lungs clear bilaterally Cardio: Regular rate and rhythm GI: No hepatosplenomegaly, nontender, no mass Vascular: No leg edema GU: Right testis without mass, 1 cm soft mobile nodule at the superior pole of the right testicle   Lab Results:  Lab Results  Component Value Date   WBC 5.0 02/25/2021   HGB 14.9 02/25/2021   HCT 43.3 02/25/2021   MCV 93.9 02/25/2021   PLT 248 02/25/2021   NEUTROABS 2.1 02/25/2021    CMP  Lab Results  Component Value Date   NA 135 07/15/2022   K 3.9 07/15/2022   CL 102 07/15/2022   CO2 25 07/15/2022   GLUCOSE 85 07/15/2022   BUN 17 07/15/2022   CREATININE 0.99 07/15/2022   CALCIUM 9.6 07/15/2022   PROT 6.9 10/17/2020   ALBUMIN 4.2 10/17/2020   AST 17 10/17/2020   ALT 21 10/17/2020   ALKPHOS 57 10/17/2020   BILITOT 0.8 10/17/2020   GFRNONAA >60 07/15/2022    No results found for: "CEA1", "CEA", "NWG956", "CA125"  No results found for: "INR", "LABPROT"  Imaging:  No results found.  Medications: I have reviewed the patient's current medications.   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, pNx,M0 Left inguinal orchiectomy 08/13/2020- tumor confined to the testis, no lymphovascular invasion CTs 08/23/2020-no evidence of metastatic disease, minimal peribronchovascular groundglass in the posterior segment right upper lobe, punctate left renal stones Elevated  preoperative AFP, AFP mildly elevated 08/30/2020, AFP normal on 09/12/2020 CT abdomen/pelvis and chest x-ray 12/18/2020-negative AFP and hCG 12/18/2020, 02/25/2021-normal, 05/09/2021-normal, 07/15/2022-normal CT abdomen/pelvis 06/27/2021-no evidence of metastatic disease Chest x-ray 08/06/2021-no active disease CT abdomen/pelvis 12/05/2021-no evidence of recurrent disease Chest x-ray 04/03/2022-no active cardiopulmonary disease Chest x-ray 07/15/2022-no active cardiopulmonary disease CTs abdomen/pelvis 07/15/2022-post left orchiectomy, no evidence of metastatic disease. History of left testicular pain secondary to #1 History of kidney stones    Disposition: Mr. Gater is in clinical remission from testicular cancer.  We will follow-up on the serum tumor markers from today.  He will return for an office visit and surveillance imaging in 6 months.  Thornton Papas, MD  12/04/2022  10:12 AM

## 2022-12-08 ENCOUNTER — Telehealth: Payer: Self-pay

## 2022-12-08 NOTE — Telephone Encounter (Signed)
-----   Message from Thornton Papas sent at 12/08/2022  1:19 PM EDT ----- Please call patient with the tumor marker from 12/04/2022 are normal, follow-up as scheduled

## 2022-12-08 NOTE — Telephone Encounter (Signed)
Patient gave verbal understanding and had no further questions or concerns  

## 2023-03-04 DIAGNOSIS — L821 Other seborrheic keratosis: Secondary | ICD-10-CM | POA: Diagnosis not present

## 2023-03-04 DIAGNOSIS — D2262 Melanocytic nevi of left upper limb, including shoulder: Secondary | ICD-10-CM | POA: Diagnosis not present

## 2023-03-04 DIAGNOSIS — L814 Other melanin hyperpigmentation: Secondary | ICD-10-CM | POA: Diagnosis not present

## 2023-03-04 DIAGNOSIS — D225 Melanocytic nevi of trunk: Secondary | ICD-10-CM | POA: Diagnosis not present

## 2023-04-21 ENCOUNTER — Telehealth: Payer: Self-pay | Admitting: *Deleted

## 2023-04-21 NOTE — Telephone Encounter (Signed)
LVM with CT appointment on 2/4 at 1045/1100. Lab at 10 am. Requested he call to confirm or confirm w/MyChart message.

## 2023-06-01 ENCOUNTER — Telehealth: Payer: Self-pay | Admitting: *Deleted

## 2023-06-01 ENCOUNTER — Other Ambulatory Visit: Payer: Self-pay | Admitting: *Deleted

## 2023-06-01 DIAGNOSIS — C6292 Malignant neoplasm of left testis, unspecified whether descended or undescended: Secondary | ICD-10-CM

## 2023-06-01 NOTE — Telephone Encounter (Signed)
Notified by managed care that [employer] does not approve scan at [facility] any longer. Mr. Ionescu notified and CT scheduled for D[facility] at [facility] on 2/7 at 10:20/10:40. Pick up oral contrast any day prior to day of scan. He will come here for labs tomorrow. Confirmed his CXR can easily be added that day as walk-in.

## 2023-06-02 ENCOUNTER — Inpatient Hospital Stay: Payer: BC Managed Care – PPO | Attending: Oncology

## 2023-06-02 DIAGNOSIS — Z8547 Personal history of malignant neoplasm of testis: Secondary | ICD-10-CM | POA: Insufficient documentation

## 2023-06-02 DIAGNOSIS — R772 Abnormality of alphafetoprotein: Secondary | ICD-10-CM | POA: Diagnosis not present

## 2023-06-02 DIAGNOSIS — C6292 Malignant neoplasm of left testis, unspecified whether descended or undescended: Secondary | ICD-10-CM

## 2023-06-02 LAB — BASIC METABOLIC PANEL - CANCER CENTER ONLY
Anion gap: 6 (ref 5–15)
BUN: 13 mg/dL (ref 6–20)
CO2: 28 mmol/L (ref 22–32)
Calcium: 9 mg/dL (ref 8.9–10.3)
Chloride: 104 mmol/L (ref 98–111)
Creatinine: 0.77 mg/dL (ref 0.61–1.24)
GFR, Estimated: >60 mL/min (ref >60)
Glucose, Bld: 95 mg/dL (ref 70–99)
Potassium: 4.2 mmol/L (ref 3.5–5.1)
Sodium: 138 mmol/L (ref 135–145)

## 2023-06-03 LAB — AFP TUMOR MARKER: AFP, Serum, Tumor Marker: 2.6 ng/mL (ref 0.0–6.9)

## 2023-06-03 LAB — BETA HCG QUANT (REF LAB): hCG Quant: <1 m[IU]/mL (ref 0–3)

## 2023-06-09 ENCOUNTER — Other Ambulatory Visit: Payer: BC Managed Care – PPO

## 2023-06-09 ENCOUNTER — Ambulatory Visit (HOSPITAL_BASED_OUTPATIENT_CLINIC_OR_DEPARTMENT_OTHER): Payer: BC Managed Care – PPO

## 2023-06-09 ENCOUNTER — Other Ambulatory Visit: Payer: Self-pay

## 2023-06-12 ENCOUNTER — Ambulatory Visit
Admission: RE | Admit: 2023-06-12 | Discharge: 2023-06-12 | Disposition: A | Discharge status: Home / Self Care | Payer: BC Managed Care – PPO | Source: Ambulatory Visit | Attending: Oncology | Admitting: Oncology

## 2023-06-12 DIAGNOSIS — C6292 Malignant neoplasm of left testis, unspecified whether descended or undescended: Secondary | ICD-10-CM

## 2023-06-12 MED ORDER — IOPAMIDOL (ISOVUE-300) INJECTION 61%
100.0000 mL | Freq: Once | INTRAVENOUS | Status: AC | PRN
  Administered 2023-06-12: 100 mL via INTRAVENOUS

## 2023-06-15 ENCOUNTER — Telehealth: Payer: Self-pay | Admitting: Oncology

## 2023-06-15 NOTE — Telephone Encounter (Signed)
 Telephone call complete.

## 2023-06-16 ENCOUNTER — Inpatient Hospital Stay: Payer: BC Managed Care – PPO | Admitting: Oncology

## 2023-06-25 ENCOUNTER — Inpatient Hospital Stay: Payer: BC Managed Care – PPO | Attending: Oncology | Admitting: Oncology

## 2023-06-25 ENCOUNTER — Ambulatory Visit (HOSPITAL_BASED_OUTPATIENT_CLINIC_OR_DEPARTMENT_OTHER)
Admission: RE | Admit: 2023-06-25 | Discharge: 2023-06-25 | Disposition: A | Discharge status: Home / Self Care | Payer: BC Managed Care – PPO | Source: Ambulatory Visit | Attending: Oncology | Admitting: Oncology

## 2023-06-25 VITALS — BP 129/93 | HR 91 | Temp 98.2°F | Resp 18 | Ht 71.0 in | Wt 196.6 lb

## 2023-06-25 DIAGNOSIS — C6292 Malignant neoplasm of left testis, unspecified whether descended or undescended: Secondary | ICD-10-CM

## 2023-06-25 DIAGNOSIS — Z8547 Personal history of malignant neoplasm of testis: Secondary | ICD-10-CM | POA: Diagnosis not present

## 2023-06-25 DIAGNOSIS — Z87442 Personal history of urinary calculi: Secondary | ICD-10-CM | POA: Diagnosis not present

## 2023-06-25 NOTE — Progress Notes (Signed)
  [facility] OFFICE PROGRESS NOTE   Diagnosis: Testicular cancer  INTERVAL HISTORY:   Bruce Davidson returns as scheduled.  He feels well.  Good appetite.  He reports intentional weight loss with a change in his diet and exercise.  No change at the right testicle.  Objective:  Vital signs in last 24 hours:  Blood pressure (!) 129/93, pulse 91, temperature 98.2 F (36.8 C), resp. rate 18, height 5\' 11"  (1.803 m), weight 196 lb 9.6 oz (89.2 kg), SpO2 99%.     Lymphatics: No cervical, supraclavicular, axillary, or inguinal nodes Resp: Lungs clear bilaterally Cardio: Regular rate and rhythm GI: No hepatosplenomegaly, nontender, no mass Vascular: No leg edema GU: 1 cm nodule at the superior aspect of the right testicle, palpable epididymis at the inferior pole of the testicle, no testicular mass  Portacath/PICC-without erythema Lab Results:  Lab Results  Component Value Date   WBC 5.0 02/25/2021   HGB 14.9 02/25/2021   HCT 43.3 02/25/2021   MCV 93.9 02/25/2021   PLT 248 02/25/2021   NEUTROABS 2.1 02/25/2021    CMP  Lab Results  Component Value Date   NA 138 06/02/2023   K 4.2 06/02/2023   CL 104 06/02/2023   CO2 28 06/02/2023   GLUCOSE 95 06/02/2023   BUN 13 06/02/2023   CREATININE 0.77 06/02/2023   CALCIUM 9.0 06/02/2023   PROT 6.9 10/17/2020   ALBUMIN 4.2 10/17/2020   AST 17 10/17/2020   ALT 21 10/17/2020   ALKPHOS 57 10/17/2020   BILITOT 0.8 10/17/2020   GFRNONAA >60 06/02/2023    Medications: I have reviewed the patient's current medications.   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, pNx,M0 Left inguinal orchiectomy 08/13/2020- tumor confined to the testis, no lymphovascular invasion CTs 08/23/2020-no evidence of metastatic disease, minimal peribronchovascular groundglass in the posterior segment right upper lobe, punctate left renal stones Elevated preoperative AFP, AFP mildly elevated 08/30/2020, AFP  normal on 09/12/2020 CT abdomen/pelvis and chest x-ray 12/18/2020-negative AFP and hCG 12/18/2020, 02/25/2021-normal, 05/09/2021-normal, 07/15/2022-normal CT abdomen/pelvis 06/27/2021-no evidence of metastatic disease Chest x-ray 08/06/2021-no active disease CT abdomen/pelvis 12/05/2021-no evidence of recurrent disease Chest x-ray 04/03/2022-no active cardiopulmonary disease Chest x-ray 07/15/2022-no active cardiopulmonary disease CTs abdomen/pelvis 07/15/2022-post left orchiectomy, no evidence of metastatic disease. CT abdomen/pelvis 06/12/2023-no evidence of recurrent disease, left orchiectomy History of left testicular pain secondary to #1 History of kidney stones     Disposition: Bruce Davidson is in clinical remission from testicular cancer.  He will return for an office visit and serum tumor markers in 6 months.  He will have a chest x-ray today.  We will plan for a final surveillance CT in 1 year.  Bruce Papas, MD  06/25/2023  9:12 AM

## 2023-06-29 DIAGNOSIS — F1320 Sedative, hypnotic or anxiolytic dependence, uncomplicated: Secondary | ICD-10-CM | POA: Diagnosis not present

## 2023-06-29 DIAGNOSIS — F1020 Alcohol dependence, uncomplicated: Secondary | ICD-10-CM | POA: Diagnosis not present

## 2023-06-29 DIAGNOSIS — F4381 Prolonged grief disorder: Secondary | ICD-10-CM | POA: Diagnosis not present

## 2023-07-02 DIAGNOSIS — F1020 Alcohol dependence, uncomplicated: Secondary | ICD-10-CM | POA: Diagnosis not present

## 2023-07-06 ENCOUNTER — Telehealth: Payer: Self-pay | Admitting: *Deleted

## 2023-07-06 DIAGNOSIS — F1320 Sedative, hypnotic or anxiolytic dependence, uncomplicated: Secondary | ICD-10-CM | POA: Diagnosis not present

## 2023-07-06 DIAGNOSIS — F1020 Alcohol dependence, uncomplicated: Secondary | ICD-10-CM | POA: Diagnosis not present

## 2023-07-06 DIAGNOSIS — F4381 Prolonged grief disorder: Secondary | ICD-10-CM | POA: Diagnosis not present

## 2023-07-06 DIAGNOSIS — I10 Essential (primary) hypertension: Secondary | ICD-10-CM | POA: Diagnosis not present

## 2023-07-06 NOTE — Telephone Encounter (Signed)
 Notified Bruce Davidson that CXR was negative for any cancer or pulmonary/cardiac disease.

## 2023-07-06 NOTE — Telephone Encounter (Signed)
-----   Message from Thornton Papas sent at 07/06/2023  2:17 PM EST ----- Please call patient, chest x-ray is negative, follow-up as scheduled

## 2023-07-16 DIAGNOSIS — F1020 Alcohol dependence, uncomplicated: Secondary | ICD-10-CM | POA: Diagnosis not present

## 2023-07-16 DIAGNOSIS — F4381 Prolonged grief disorder: Secondary | ICD-10-CM | POA: Diagnosis not present

## 2023-07-16 DIAGNOSIS — F1320 Sedative, hypnotic or anxiolytic dependence, uncomplicated: Secondary | ICD-10-CM | POA: Diagnosis not present

## 2023-07-29 DIAGNOSIS — Z6827 Body mass index (BMI) 27.0-27.9, adult: Secondary | ICD-10-CM | POA: Diagnosis not present

## 2023-07-29 DIAGNOSIS — M5412 Radiculopathy, cervical region: Secondary | ICD-10-CM | POA: Diagnosis not present

## 2023-08-03 DIAGNOSIS — F1020 Alcohol dependence, uncomplicated: Secondary | ICD-10-CM | POA: Diagnosis not present

## 2023-08-03 DIAGNOSIS — F4381 Prolonged grief disorder: Secondary | ICD-10-CM | POA: Diagnosis not present

## 2023-08-03 DIAGNOSIS — F1320 Sedative, hypnotic or anxiolytic dependence, uncomplicated: Secondary | ICD-10-CM | POA: Diagnosis not present

## 2023-08-25 DIAGNOSIS — R200 Anesthesia of skin: Secondary | ICD-10-CM | POA: Diagnosis not present

## 2023-08-25 DIAGNOSIS — M50222 Other cervical disc displacement at C5-C6 level: Secondary | ICD-10-CM | POA: Diagnosis not present

## 2023-08-25 DIAGNOSIS — M5412 Radiculopathy, cervical region: Secondary | ICD-10-CM | POA: Diagnosis not present

## 2023-08-25 DIAGNOSIS — M4802 Spinal stenosis, cervical region: Secondary | ICD-10-CM | POA: Diagnosis not present

## 2023-08-25 DIAGNOSIS — M50223 Other cervical disc displacement at C6-C7 level: Secondary | ICD-10-CM | POA: Diagnosis not present

## 2023-10-06 DIAGNOSIS — Z125 Encounter for screening for malignant neoplasm of prostate: Secondary | ICD-10-CM | POA: Diagnosis not present

## 2023-10-06 DIAGNOSIS — E785 Hyperlipidemia, unspecified: Secondary | ICD-10-CM | POA: Diagnosis not present

## 2023-10-06 DIAGNOSIS — Z1389 Encounter for screening for other disorder: Secondary | ICD-10-CM | POA: Diagnosis not present

## 2023-10-13 DIAGNOSIS — Z1339 Encounter for screening examination for other mental health and behavioral disorders: Secondary | ICD-10-CM | POA: Diagnosis not present

## 2023-10-13 DIAGNOSIS — R82998 Other abnormal findings in urine: Secondary | ICD-10-CM | POA: Diagnosis not present

## 2023-10-13 DIAGNOSIS — Z0000 Encounter for general adult medical examination without abnormal findings: Secondary | ICD-10-CM | POA: Diagnosis not present

## 2023-10-13 DIAGNOSIS — I10 Essential (primary) hypertension: Secondary | ICD-10-CM | POA: Diagnosis not present

## 2023-10-13 DIAGNOSIS — Z1331 Encounter for screening for depression: Secondary | ICD-10-CM | POA: Diagnosis not present

## 2023-10-14 DIAGNOSIS — M5412 Radiculopathy, cervical region: Secondary | ICD-10-CM | POA: Diagnosis not present

## 2023-10-14 DIAGNOSIS — Z6826 Body mass index (BMI) 26.0-26.9, adult: Secondary | ICD-10-CM | POA: Diagnosis not present

## 2023-11-16 DIAGNOSIS — M5412 Radiculopathy, cervical region: Secondary | ICD-10-CM | POA: Diagnosis not present

## 2023-11-18 DIAGNOSIS — M5412 Radiculopathy, cervical region: Secondary | ICD-10-CM | POA: Diagnosis not present

## 2023-11-23 DIAGNOSIS — M5412 Radiculopathy, cervical region: Secondary | ICD-10-CM | POA: Diagnosis not present

## 2023-11-24 DIAGNOSIS — M5412 Radiculopathy, cervical region: Secondary | ICD-10-CM | POA: Diagnosis not present

## 2023-11-25 DIAGNOSIS — Z8547 Personal history of malignant neoplasm of testis: Secondary | ICD-10-CM | POA: Diagnosis not present

## 2023-11-25 DIAGNOSIS — N503 Cyst of epididymis: Secondary | ICD-10-CM | POA: Diagnosis not present

## 2023-11-30 DIAGNOSIS — M5412 Radiculopathy, cervical region: Secondary | ICD-10-CM | POA: Diagnosis not present

## 2023-12-03 DIAGNOSIS — J018 Other acute sinusitis: Secondary | ICD-10-CM | POA: Diagnosis not present

## 2023-12-14 DIAGNOSIS — M5412 Radiculopathy, cervical region: Secondary | ICD-10-CM | POA: Diagnosis not present

## 2023-12-16 DIAGNOSIS — M5412 Radiculopathy, cervical region: Secondary | ICD-10-CM | POA: Diagnosis not present

## 2023-12-17 ENCOUNTER — Inpatient Hospital Stay: Payer: BC Managed Care – PPO | Attending: Oncology

## 2023-12-17 DIAGNOSIS — Z8547 Personal history of malignant neoplasm of testis: Secondary | ICD-10-CM | POA: Diagnosis not present

## 2023-12-17 DIAGNOSIS — C6292 Malignant neoplasm of left testis, unspecified whether descended or undescended: Secondary | ICD-10-CM

## 2023-12-17 DIAGNOSIS — Z87442 Personal history of urinary calculi: Secondary | ICD-10-CM | POA: Insufficient documentation

## 2023-12-18 LAB — AFP TUMOR MARKER: AFP, Serum, Tumor Marker: 2 ng/mL (ref 0.0–6.9)

## 2023-12-18 LAB — BETA HCG QUANT (REF LAB): hCG Quant: <1 m[IU]/mL (ref 0–3)

## 2023-12-23 DIAGNOSIS — Z6826 Body mass index (BMI) 26.0-26.9, adult: Secondary | ICD-10-CM | POA: Diagnosis not present

## 2023-12-23 DIAGNOSIS — M5412 Radiculopathy, cervical region: Secondary | ICD-10-CM | POA: Diagnosis not present

## 2023-12-24 ENCOUNTER — Inpatient Hospital Stay: Payer: BC Managed Care – PPO | Admitting: Oncology

## 2023-12-24 VITALS — BP 118/83 | HR 71 | Temp 98.1°F | Resp 18 | Ht 71.0 in | Wt 195.3 lb

## 2023-12-24 DIAGNOSIS — Z87442 Personal history of urinary calculi: Secondary | ICD-10-CM | POA: Diagnosis not present

## 2023-12-24 DIAGNOSIS — C6292 Malignant neoplasm of left testis, unspecified whether descended or undescended: Secondary | ICD-10-CM | POA: Diagnosis not present

## 2023-12-24 DIAGNOSIS — Z8547 Personal history of malignant neoplasm of testis: Secondary | ICD-10-CM | POA: Diagnosis not present

## 2023-12-24 NOTE — Progress Notes (Signed)
  [name] OFFICE PROGRESS NOTE   Diagnosis: Testicular cancer  INTERVAL HISTORY:   Bruce Davidson returns as scheduled.  He feels well.  He saw urology for evaluation of the left testicle and reports an ultrasound confirmed a cyst.  Good appetite.  No complaint.  Objective:  Vital signs in last 24 hours:  Blood pressure 118/83, pulse 71, temperature 98.1 F (36.7 C), temperature source Temporal, resp. rate 18, height 5' 11 (1.803 m), weight 195 lb 4.8 oz (88.6 kg), SpO2 100%.     Lymphatics: No cervical, supraclavicular, axillary, or inguinal nodes Resp: Lungs clear bilaterally Cardio: Regular rate and rhythm GI: No hepatosplenomegaly, no mass Vascular: No leg edema GU: Right testis without mass, 1 cm mobile nodule superior to the right testicle   Lab Results:  Lab Results  Component Value Date   WBC 5.0 02/25/2021   HGB 14.9 02/25/2021   HCT 43.3 02/25/2021   MCV 93.9 02/25/2021   PLT 248 02/25/2021   NEUTROABS 2.1 02/25/2021    CMP  Lab Results  Component Value Date   NA 138 06/02/2023   K 4.2 06/02/2023   CL 104 06/02/2023   CO2 28 06/02/2023   GLUCOSE 95 06/02/2023   BUN 13 06/02/2023   CREATININE 0.77 06/02/2023   CALCIUM 9.0 06/02/2023   PROT 6.9 10/17/2020   ALBUMIN 4.2 10/17/2020   AST 17 10/17/2020   ALT 21 10/17/2020   ALKPHOS 57 10/17/2020   BILITOT 0.8 10/17/2020   GFRNONAA >60 06/02/2023     Medications: I have reviewed the patient's current medications.   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, pNx,M0 Left inguinal orchiectomy 08/13/2020- tumor confined to the testis, no lymphovascular invasion CTs 08/23/2020-no evidence of metastatic disease, minimal peribronchovascular groundglass in the posterior segment right upper lobe, punctate left renal stones Elevated preoperative AFP, AFP mildly elevated 08/30/2020, AFP normal on 09/12/2020 CT abdomen/pelvis and chest x-ray  12/18/2020-negative AFP and hCG 12/18/2020, 02/25/2021-normal, 05/09/2021-normal, 07/15/2022-normal CT abdomen/pelvis 06/27/2021-no evidence of metastatic disease Chest x-ray 08/06/2021-no active disease CT abdomen/pelvis 12/05/2021-no evidence of recurrent disease Chest x-ray 04/03/2022-no active cardiopulmonary disease Chest x-ray 07/15/2022-no active cardiopulmonary disease CTs abdomen/pelvis 07/15/2022-post left orchiectomy, no evidence of metastatic disease. CT abdomen/pelvis 06/12/2023-no evidence of recurrent disease, left orchiectomy History of left testicular pain secondary to #1 History of kidney stones       Disposition: Bruce Davidson is in clinical remission from testicular cancer.  The serum tumor markers were negative last week.  He will return for an office visit, labs, and surveillance imaging in 6 months.  Arley Hof, MD  12/24/2023  9:09 AM

## 2023-12-30 ENCOUNTER — Encounter: Payer: Self-pay | Admitting: Internal Medicine

## 2024-03-07 DIAGNOSIS — L820 Inflamed seborrheic keratosis: Secondary | ICD-10-CM | POA: Diagnosis not present

## 2024-03-07 DIAGNOSIS — L821 Other seborrheic keratosis: Secondary | ICD-10-CM | POA: Diagnosis not present

## 2024-03-07 DIAGNOSIS — D225 Melanocytic nevi of trunk: Secondary | ICD-10-CM | POA: Diagnosis not present

## 2024-03-07 DIAGNOSIS — L814 Other melanin hyperpigmentation: Secondary | ICD-10-CM | POA: Diagnosis not present

## 2024-03-07 DIAGNOSIS — L819 Disorder of pigmentation, unspecified: Secondary | ICD-10-CM | POA: Diagnosis not present

## 2024-04-07 ENCOUNTER — Ambulatory Visit (HOSPITAL_BASED_OUTPATIENT_CLINIC_OR_DEPARTMENT_OTHER)
Admission: RE | Admit: 2024-04-07 | Discharge: 2024-04-07 | Disposition: A | Source: Ambulatory Visit | Attending: Oncology | Admitting: Oncology

## 2024-04-07 DIAGNOSIS — C6292 Malignant neoplasm of left testis, unspecified whether descended or undescended: Secondary | ICD-10-CM | POA: Diagnosis not present

## 2024-04-07 DIAGNOSIS — C6290 Malignant neoplasm of unspecified testis, unspecified whether descended or undescended: Secondary | ICD-10-CM | POA: Diagnosis not present

## 2024-04-14 ENCOUNTER — Encounter (HOSPITAL_BASED_OUTPATIENT_CLINIC_OR_DEPARTMENT_OTHER): Payer: Self-pay

## 2024-04-18 ENCOUNTER — Ambulatory Visit (HOSPITAL_BASED_OUTPATIENT_CLINIC_OR_DEPARTMENT_OTHER): Admission: RE | Admit: 2024-04-18 | Discharge: 2024-04-18 | Attending: Oncology | Admitting: Oncology

## 2024-04-18 ENCOUNTER — Inpatient Hospital Stay: Attending: Oncology

## 2024-04-18 ENCOUNTER — Ambulatory Visit: Payer: Self-pay | Admitting: Oncology

## 2024-04-18 DIAGNOSIS — Z08 Encounter for follow-up examination after completed treatment for malignant neoplasm: Secondary | ICD-10-CM | POA: Insufficient documentation

## 2024-04-18 DIAGNOSIS — Z8547 Personal history of malignant neoplasm of testis: Secondary | ICD-10-CM | POA: Insufficient documentation

## 2024-04-18 DIAGNOSIS — Z87442 Personal history of urinary calculi: Secondary | ICD-10-CM | POA: Diagnosis not present

## 2024-04-18 DIAGNOSIS — C6292 Malignant neoplasm of left testis, unspecified whether descended or undescended: Secondary | ICD-10-CM | POA: Insufficient documentation

## 2024-04-18 DIAGNOSIS — N4340 Spermatocele of epididymis, unspecified: Secondary | ICD-10-CM | POA: Insufficient documentation

## 2024-04-18 LAB — BASIC METABOLIC PANEL - CANCER CENTER ONLY
Anion gap: 8 (ref 5–15)
BUN: 18 mg/dL (ref 6–20)
CO2: 30 mmol/L (ref 22–32)
Calcium: 9.6 mg/dL (ref 8.9–10.3)
Chloride: 101 mmol/L (ref 98–111)
Creatinine: 1.3 mg/dL — ABNORMAL HIGH (ref 0.61–1.24)
GFR, Estimated: >60 mL/min (ref >60)
Glucose, Bld: 90 mg/dL (ref 70–99)
Potassium: 4 mmol/L (ref 3.5–5.1)
Sodium: 139 mmol/L (ref 135–145)

## 2024-04-18 MED ORDER — IOHEXOL 300 MG/ML  SOLN
100.0000 mL | Freq: Once | INTRAMUSCULAR | Status: AC | PRN
  Administered 2024-04-18: 17:00:00 100 mL via INTRAVENOUS

## 2024-04-20 ENCOUNTER — Other Ambulatory Visit: Payer: Self-pay

## 2024-04-20 DIAGNOSIS — C6292 Malignant neoplasm of left testis, unspecified whether descended or undescended: Secondary | ICD-10-CM

## 2024-04-20 NOTE — Telephone Encounter (Signed)
-----   Message from Arley Hof, MD sent at 04/18/2024  7:58 PM EST ----- Creatinine mildly elevated, repeat bmp 12/18, ask patient to increase fluids

## 2024-04-20 NOTE — Telephone Encounter (Signed)
 Patient gave verbal understanding and had no further questions.

## 2024-04-21 ENCOUNTER — Encounter: Payer: Self-pay | Admitting: Oncology

## 2024-04-21 ENCOUNTER — Inpatient Hospital Stay

## 2024-04-21 ENCOUNTER — Other Ambulatory Visit: Payer: Self-pay | Admitting: *Deleted

## 2024-04-21 ENCOUNTER — Inpatient Hospital Stay: Admitting: Oncology

## 2024-04-21 VITALS — BP 130/85 | HR 99 | Temp 98.3°F | Resp 18 | Ht 71.0 in | Wt 198.5 lb

## 2024-04-21 DIAGNOSIS — C6292 Malignant neoplasm of left testis, unspecified whether descended or undescended: Secondary | ICD-10-CM

## 2024-04-21 DIAGNOSIS — Z08 Encounter for follow-up examination after completed treatment for malignant neoplasm: Secondary | ICD-10-CM | POA: Diagnosis not present

## 2024-04-21 LAB — BASIC METABOLIC PANEL - CANCER CENTER ONLY
Anion gap: 11 (ref 5–15)
Anion gap: 12 (ref 5–15)
BUN: 14 mg/dL (ref 6–20)
BUN: 14 mg/dL (ref 6–20)
CO2: 29 mmol/L (ref 22–32)
CO2: 29 mmol/L (ref 22–32)
Calcium: 10.8 mg/dL — ABNORMAL HIGH (ref 8.9–10.3)
Calcium: 10.4 mg/dL — ABNORMAL HIGH (ref 8.9–10.3)
Chloride: 98 mmol/L (ref 98–111)
Chloride: 98 mmol/L (ref 98–111)
Creatinine: 1.02 mg/dL (ref 0.61–1.24)
Creatinine: 1.01 mg/dL (ref 0.61–1.24)
GFR, Estimated: >60 mL/min (ref >60)
GFR, Estimated: >60 mL/min (ref >60)
Glucose, Bld: 88 mg/dL (ref 70–99)
Glucose, Bld: 91 mg/dL (ref 70–99)
Potassium: 3.5 mmol/L (ref 3.5–5.1)
Potassium: 3.9 mmol/L (ref 3.5–5.1)
Sodium: 138 mmol/L (ref 135–145)
Sodium: 139 mmol/L (ref 135–145)

## 2024-04-21 NOTE — Progress Notes (Signed)
 [facility] OFFICE PROGRESS NOTE   Diagnosis: Testicular cancer  INTERVAL HISTORY:   Bruce Davidson returns as scheduled.  He feels well.  Good appetite.  No palpable change at the scrotum.  He reports the right testicular cyst is stable.  A testicular ultrasound 11/25/2023 revealed a 0.8 cm right epididymal cyst.  He has intermittent discomfort at the bilateral flank area.  Objective:  Vital signs in last 24 hours:  Blood pressure 130/85, pulse 99, temperature 98.3 F (36.8 C), temperature source Temporal, resp. rate 18, height 5' 11 (1.803 m), weight 198 lb 8 oz (90 kg), SpO2 99%.   Lymphatics: No cervical, supraclavicular, axillary, or inguinal nodes Resp: Lungs clear bilaterally Cardio: Regular rate and rhythm GI: No hepatosplenomegaly, nontender, no mass Vascular: No leg edema GU: Right testis without mass, 1 cm round lesion at upper pole/epididymis of the right kidney Musculoskeletal: No flank tenderness    Lab Results:  Lab Results  Component Value Date   WBC 5.0 02/25/2021   HGB 14.9 02/25/2021   HCT 43.3 02/25/2021   MCV 93.9 02/25/2021   PLT 248 02/25/2021   NEUTROABS 2.1 02/25/2021    CMP  Lab Results  Component Value Date   NA 138 04/21/2024   K 3.5 04/21/2024   CL 98 04/21/2024   CO2 29 04/21/2024   GLUCOSE 88 04/21/2024   BUN 14 04/21/2024   CREATININE 1.02 04/21/2024   CALCIUM 10.8 (H) 04/21/2024   PROT 6.9 10/17/2020   ALBUMIN 4.2 10/17/2020   AST 17 10/17/2020   ALT 21 10/17/2020   ALKPHOS 57 10/17/2020   BILITOT 0.8 10/17/2020   GFRNONAA >60 04/21/2024    No results found for: CEA1, CEA, CAN199, CA125  No results found for: INR, LABPROT  Imaging:  CT ABDOMEN PELVIS W CONTRAST Result Date: 04/21/2024 CLINICAL DATA:  surveillance, h/o testicular cancer. * Tracking Code: BO * EXAM: CT ABDOMEN AND PELVIS WITH CONTRAST TECHNIQUE: Multidetector CT imaging of the abdomen and pelvis was performed using the standard  protocol following bolus administration of intravenous contrast. RADIATION DOSE REDUCTION: This exam was performed according to the departmental dose-optimization program which includes automated exposure control, adjustment of the mA and/or kV according to patient size and/or use of iterative reconstruction technique. CONTRAST:  OMNIPAQUE  IOHEXOL  300 MG/ML  SOLN COMPARISON:  CT scan abdomen and pelvis from 06/12/2023. FINDINGS: Lower chest: There are subpleural atelectatic changes in the visualized lung bases. No overt consolidation. No pleural effusion. The heart is normal in size. No pericardial effusion. Hepatobiliary: The liver is normal in size. Non-cirrhotic configuration. No suspicious mass. No intrahepatic or extrahepatic bile duct dilation. No calcified gallstones. Normal gallbladder wall thickness. No pericholecystic inflammatory changes. Pancreas: Unremarkable. No pancreatic ductal dilatation or surrounding inflammatory changes. Spleen: Within normal limits. No focal lesion. Adrenals/Urinary Tract: Adrenal glands are unremarkable. No suspicious renal mass. There is a partially exophytic 3.3 x 3.5 cm cyst arising from the right kidney interpolar region, posteriorly. No nephroureterolithiasis or obstructive uropathy. Urinary bladder is under distended, precluding optimal assessment. However, no large mass or stones identified. No perivesical fat stranding. Stomach/Bowel: No disproportionate dilation of the small or large bowel loops. No evidence of abnormal bowel wall thickening or inflammatory changes. The appendix is unremarkable. Vascular/Lymphatic: No ascites or pneumoperitoneum. No abdominal or pelvic lymphadenopathy, by size criteria. No aneurysmal dilation of the major abdominal arteries. Circumaortic accessory left renal vein noted. Reproductive: Normal-sized prostate gland. Patient is status post left orchiectomy. Other: There is a  tiny fat containing umbilical hernia. The soft tissues and  abdominal wall are otherwise unremarkable. Musculoskeletal: No suspicious osseous lesions. Mild degenerative changes at L1-2 intervertebral disc level. IMPRESSION: 1. No metastatic disease identified within the abdomen or pelvis. 2. Other nonacute observations, as described above. Electronically Signed   By: Ree Molt M.D.   On: 04/21/2024 08:50    Medications: I have reviewed the patient's current medications.   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, pNx,M0 Left inguinal orchiectomy 08/13/2020- tumor confined to the testis, no lymphovascular invasion CTs 08/23/2020-no evidence of metastatic disease, minimal peribronchovascular groundglass in the posterior segment right upper lobe, punctate left renal stones Elevated preoperative AFP, AFP mildly elevated 08/30/2020, AFP normal on 09/12/2020 CT abdomen/pelvis and chest x-ray 12/18/2020-negative AFP and hCG 12/18/2020, 02/25/2021-normal, 05/09/2021-normal, 07/15/2022-normal CT abdomen/pelvis 06/27/2021-no evidence of metastatic disease Chest x-ray 08/06/2021-no active disease CT abdomen/pelvis 12/05/2021-no evidence of recurrent disease Chest x-ray 04/03/2022-no active cardiopulmonary disease Chest x-ray 07/15/2022-no active cardiopulmonary disease CTs abdomen/pelvis 07/15/2022-post left orchiectomy, no evidence of metastatic disease. CT abdomen/pelvis 06/12/2023-no evidence of recurrent disease, left orchiectomy Chest x-ray 04/07/2024-no evidence of metastatic disease CT abdomen/pelvis 04/18/2024-no evidence of metastatic disease History of left testicular pain secondary to #1 History of kidney stones       Disposition: Bruce Davidson remains in clinical remission from testicular cancer.  He is now almost 4 years out from diagnosis.  We will follow-up on the serum tumor markers from today.  He will return for serum tumor markers and 6 months.  He will be scheduled for an office visit, serum tumor markers, and a  chest x-ray in December 2025.  The calcium was mildly elevated on a chemistry panel today and was normal on 04/18/2024.  This is likely benign nonspecific finding.  He reports he is scheduled to see Dr. Loreli in a few months.  Arley Hof, MD  04/21/2024  1:58 PM

## 2024-04-22 ENCOUNTER — Ambulatory Visit: Payer: Self-pay | Admitting: Oncology

## 2024-04-22 LAB — BETA HCG QUANT (REF LAB): hCG Quant: <1 m[IU]/mL (ref 0–3)

## 2024-04-22 LAB — AFP TUMOR MARKER: AFP, Serum, Tumor Marker: 4 ng/mL (ref 0.0–6.9)

## 2024-06-24 ENCOUNTER — Other Ambulatory Visit

## 2024-06-30 ENCOUNTER — Ambulatory Visit: Admitting: Oncology

## 2024-10-20 ENCOUNTER — Inpatient Hospital Stay

## 2025-04-10 ENCOUNTER — Inpatient Hospital Stay: Admitting: Oncology

## 2025-04-10 ENCOUNTER — Inpatient Hospital Stay
